# Patient Record
Sex: Female | Born: 1985 | Race: Black or African American | Hispanic: No | Marital: Single | Smoking: Former smoker
Health system: Southern US, Community
[De-identification: ages and names within clinical notes are randomized; demographics above are authoritative.]

## PROBLEM LIST (undated history)

## (undated) DIAGNOSIS — T7840XA Allergy, unspecified, initial encounter: Secondary | ICD-10-CM

## (undated) HISTORY — DX: Allergy, unspecified, initial encounter: T78.40XA

---

## 2005-03-12 ENCOUNTER — Other Ambulatory Visit: Admission: RE | Admit: 2005-03-12 | Discharge: 2005-03-12 | Payer: Self-pay | Admitting: Obstetrics and Gynecology

## 2005-05-06 ENCOUNTER — Emergency Department (HOSPITAL_COMMUNITY): Admission: EM | Admit: 2005-05-06 | Discharge: 2005-05-07 | Payer: Self-pay | Admitting: *Deleted

## 2006-09-01 ENCOUNTER — Other Ambulatory Visit: Admission: RE | Admit: 2006-09-01 | Discharge: 2006-09-01 | Payer: Self-pay | Admitting: Obstetrics and Gynecology

## 2007-11-03 ENCOUNTER — Other Ambulatory Visit: Admission: RE | Admit: 2007-11-03 | Discharge: 2007-11-03 | Payer: Self-pay | Admitting: Obstetrics and Gynecology

## 2011-06-14 ENCOUNTER — Encounter: Payer: Self-pay | Admitting: *Deleted

## 2011-06-14 ENCOUNTER — Emergency Department (HOSPITAL_COMMUNITY)
Admission: EM | Admit: 2011-06-14 | Discharge: 2011-06-14 | Disposition: A | Discharge status: Home / Self Care | Payer: BC Managed Care – PPO | Attending: Emergency Medicine | Admitting: Emergency Medicine

## 2011-06-14 DIAGNOSIS — J45909 Unspecified asthma, uncomplicated: Secondary | ICD-10-CM | POA: Insufficient documentation

## 2011-06-14 DIAGNOSIS — J029 Acute pharyngitis, unspecified: Secondary | ICD-10-CM | POA: Insufficient documentation

## 2011-06-14 LAB — RAPID STREP SCREEN (MED CTR MEBANE ONLY): Streptococcus, Group A Screen (Direct): NEGATIVE

## 2011-06-14 MED ORDER — DEXAMETHASONE 6 MG PO TABS
10.0000 mg | ORAL_TABLET | Freq: Once | ORAL | Status: AC
  Administered 2011-06-14: 10 mg via ORAL
  Filled 2011-06-14: qty 1

## 2011-06-14 MED ORDER — ALBUTEROL SULFATE HFA 108 (90 BASE) MCG/ACT IN AERS
2.0000 | INHALATION_SPRAY | RESPIRATORY_TRACT | Status: DC | PRN
  Administered 2011-06-14: 2 via RESPIRATORY_TRACT
  Filled 2011-06-14: qty 6.7

## 2011-06-14 NOTE — ED Provider Notes (Signed)
History     CSN: [id number] Arrival date & time: 06/14/2011  5:22 AM   First MD Initiated Contact with Patient 06/14/11 [phone]      Chief Complaint  Patient presents with  . Sore Throat    (Consider location/radiation/quality/duration/timing/severity/associated sxs/prior treatment) HPI This is a 25 year old black female with a history of 3 days of sore throat. He began with sore throat radiating to the left ear along with a headache, but taking ibuprofen has relieved the headache and ear pain. The pain is mild to moderate, worse with swallowing. She also feels like she is short of breath. She has a history of asthma that only usually acts up when he has an illness. She denies nasal congestion, cough, body aches, chills, nausea, vomiting or diarrhea.  Past Medical History  Diagnosis Date  . Asthma     History reviewed. No pertinent past surgical history.  History reviewed. No pertinent family history.  History  Substance Use Topics  . Smoking status: Former Games developer  . Smokeless tobacco: Not on file  . Alcohol Use: 0.6 oz/week    1 Cans of beer per week     every other day    OB History    Grav Para Term Preterm Abortions TAB SAB Ect Mult Living                  Review of Systems  All other systems reviewed and are negative.    Allergies  Sulfa antibiotics  Home Medications  No current outpatient prescriptions on file.  BP 131/92  Pulse 88  Temp(Src) 98.9 F (37.2 C) (Oral)  Resp 20  SpO2 100%  LMP 04/18/2011  Physical Exam General: Well-developed, well-nourished female in no acute distress; appearance consistent with age of record HENT: normocephalic, atraumatic; tympanic membranes without erythema; no pharyngeal erythema or exudate, tonsils mildly enlarged Eyes: pupils equal round and reactive to light; extraocular muscles intact Neck: supple; no lymphadenopathy Heart: regular rate and rhythm Lungs: Decreased air movement without frank  wheezing Abdomen: soft; nontender; nondistended Extremities: No deformity; full range of motion Neurologic: Awake, alert and oriented; motor function intact in all extremities and symmetric; no facial droop Skin: Warm and dry Psychiatric: Normal mood and affect    ED Course  Procedures (including critical care time)   MDM           Hanley Seamen, MD 06/14/11 [phone]

## 2011-06-14 NOTE — ED Notes (Signed)
Pt c/o sore throat, had ear pain also but it resolved.PT last took ibuprofen > 6 hrs ago.

## 2012-12-28 DIAGNOSIS — Z0000 Encounter for general adult medical examination without abnormal findings: Secondary | ICD-10-CM | POA: Insufficient documentation

## 2013-05-10 DIAGNOSIS — J45909 Unspecified asthma, uncomplicated: Secondary | ICD-10-CM | POA: Insufficient documentation

## 2017-08-17 DIAGNOSIS — J324 Chronic pansinusitis: Secondary | ICD-10-CM | POA: Insufficient documentation

## 2017-08-17 DIAGNOSIS — H6983 Other specified disorders of Eustachian tube, bilateral: Secondary | ICD-10-CM | POA: Insufficient documentation

## 2017-08-17 DIAGNOSIS — J3089 Other allergic rhinitis: Secondary | ICD-10-CM | POA: Insufficient documentation

## 2017-08-17 DIAGNOSIS — H6993 Unspecified Eustachian tube disorder, bilateral: Secondary | ICD-10-CM | POA: Insufficient documentation

## 2019-07-08 ENCOUNTER — Encounter: Payer: Self-pay | Admitting: Emergency Medicine

## 2019-07-08 ENCOUNTER — Other Ambulatory Visit: Payer: Self-pay

## 2019-07-08 ENCOUNTER — Ambulatory Visit
Admission: EM | Admit: 2019-07-08 | Discharge: 2019-07-08 | Disposition: A | Discharge status: Home / Self Care | Payer: 59 | Attending: Emergency Medicine | Admitting: Emergency Medicine

## 2019-07-08 DIAGNOSIS — H60501 Unspecified acute noninfective otitis externa, right ear: Secondary | ICD-10-CM

## 2019-07-08 MED ORDER — NEOMYCIN-POLYMYXIN-HC 3.5-10000-1 OT SUSP: 4.0000 [drp] | Freq: Three times a day (TID) | OTIC | 0 refills | Status: AC

## 2019-07-08 NOTE — ED Triage Notes (Addendum)
Pt presents to [facility] for assessment of right ear pain, diminished hearing, facial pressure x 4 days.  States she is having trouble closing her jaw all the way.  Denies nasal congestion, sore throat or cough

## 2019-07-08 NOTE — ED Provider Notes (Signed)
[name]    CSN: [id number] Arrival date & time: 07/08/19  1251      History   Chief Complaint Chief Complaint  Patient presents with  . APPT: 1300  . Otalgia    HPI Danielle [name] is a 34 y.o. female with h/o morbid obesity, asthma presenting for 4 day course of right ear pain.  States pressure-like, no discharge, or recent trauma, travel.  Has tried sudafed & OTC drops w/o significant relief.  No known sick contacts.  Takes antihistamine sometimes for allergies.   Past Medical History:  Diagnosis Date  . Asthma     There are no problems to display for this patient.   History reviewed. No pertinent surgical history.  OB History   No obstetric history on file.      Home Medications    Prior to Admission medications   Medication Sig Start Date End Date Taking? Authorizing Provider  neomycin-polymyxin-hydrocortisone (CORTISPORIN) 3.5-10000-1 OTIC suspension Place 4 drops into the right ear 3 (three) times daily for 7 days. 07/08/19 07/15/19  Hall-Potvin, [place], PA-C    Family History Family History  Problem Relation Age of Onset  . Hypertension Mother     Social History Social History   Tobacco Use  . Smoking status: Former Games developer  . Smokeless tobacco: Never Used  Substance Use Topics  . Alcohol use: Yes    Alcohol/week: 1.0 standard drinks    Types: 1 Cans of beer per week    Comment: every other day  . Drug use: No     Allergies   Sulfa antibiotics   Review of Systems Review of Systems  Constitutional: Negative for activity change, appetite change, fatigue and fever.  HENT: Positive for ear pain. Negative for congestion, dental problem, ear discharge, facial swelling, hearing loss, sinus pain, sore throat, trouble swallowing and voice change.   Eyes: Negative for photophobia, pain and visual disturbance.  Respiratory: Negative for cough and shortness of breath.   Cardiovascular: Negative for chest pain and palpitations.    Gastrointestinal: Negative for diarrhea and vomiting.  Musculoskeletal: Negative for arthralgias and myalgias.  Neurological: Negative for dizziness and headaches.     Physical Exam Triage Vital Signs ED Triage Vitals [07/08/19 1335]  Enc Vitals Group     BP 122/84     Pulse Rate 79     Resp 18     Temp 98.2 F (36.8 C)     Temp Source Temporal     SpO2 98 %     Weight      Height      Head Circumference      Peak Flow      Pain Score 4     Pain Loc      Pain Edu?      Excl. in [place]?    No data found.  Updated Vital Signs BP 122/84 (BP Location: Right Wrist)   Pulse 79   Temp 98.2 F (36.8 C) (Temporal)   Resp 18   LMP 06/13/2019   SpO2 98%   Visual Acuity Right Eye Distance:   Left Eye Distance:   Bilateral Distance:    Right Eye Near:   Left Eye Near:    Bilateral Near:     Physical Exam Constitutional:      General: She is not in acute distress.    Appearance: She is obese. She is not ill-appearing.  HENT:     Head: Normocephalic and atraumatic.  Jaw: There is normal jaw occlusion. No tenderness or pain on movement.     Right Ear: Hearing and tympanic membrane normal. No tenderness. No mastoid tenderness.     Left Ear: Hearing, tympanic membrane, ear canal and external ear normal. No tenderness. No mastoid tenderness.     Ears:     Comments: Positive tragal tenderness on right.  EAC w/ moderate edema, injection, scant white discharge.    Nose: No nasal deformity, septal deviation or nasal tenderness.     Right Turbinates: Not swollen or pale.     Left Turbinates: Not swollen or pale.     Right Sinus: No maxillary sinus tenderness or frontal sinus tenderness.     Left Sinus: No maxillary sinus tenderness or frontal sinus tenderness.     Mouth/Throat:     Lips: Pink. No lesions.     Mouth: Mucous membranes are moist. No injury.     Pharynx: Oropharynx is clear. Uvula midline. No posterior oropharyngeal erythema or uvula swelling.     Comments: no  tonsillar exudate or hypertrophy Eyes:     General: No scleral icterus.    Conjunctiva/sclera: Conjunctivae normal.     Pupils: Pupils are equal, round, and reactive to light.  Cardiovascular:     Rate and Rhythm: Normal rate.  Pulmonary:     Effort: Pulmonary effort is normal. No respiratory distress.  Musculoskeletal:     Cervical back: Normal range of motion and neck supple. No tenderness. No muscular tenderness.  Lymphadenopathy:     Cervical: No cervical adenopathy.  Skin:    Capillary Refill: Capillary refill takes less than 2 seconds.  Neurological:     Mental Status: She is alert and oriented to person, place, and time.      UC Treatments / Results  Labs (all labs ordered are listed, but only abnormal results are displayed) Labs Reviewed - No data to display  EKG   Radiology No results found.  Procedures Procedures (including critical care time)  Medications Ordered in UC Medications - No data to display  Initial Impression / Assessment and Plan / UC Course  I have reviewed the triage vital signs and the nursing notes.  Pertinent labs & imaging results that were available during my care of the patient were reviewed by me and considered in my medical decision making (see chart for details).     Afebrile, nontoxic.  H&P consistent w/ EOM.  Start cortisporin. Return precautions discussed, patient verbalized understanding and is agreeable to plan. Final Clinical Impressions(s) / UC Diagnoses   Final diagnoses:  Acute otitis externa of right ear, unspecified type     Discharge Instructions     Use eardrops as prescribed for the next week. Return for worsening ear pain, swelling, discharge, bleeding, decreased hearing, development of jaw pain/swelling, fever.  Do NOT use Q-tips as these can cause your ear wax to get stuck, the tips may break off and become a foreign body requiring additional medical care, or puncture your eardrum.  Helpful prevention  tip: Use a solution of equal parts isopropyl (rubbing) alcohol and white vinegar (acetic acid) in both ears after swimming.    ED Prescriptions    Medication Sig Dispense Auth. Provider   neomycin-polymyxin-hydrocortisone (CORTISPORIN) 3.5-10000-1 OTIC suspension Place 4 drops into the right ear 3 (three) times daily for 7 days. 10 mL Hall-Potvin, [place], PA-C     PDMP not reviewed this encounter.   Hall-Potvin, [place], [place] 07/08/19 1405

## 2019-07-08 NOTE — Discharge Instructions (Signed)
Use eardrops as prescribed for the next week. Return for worsening ear pain, swelling, discharge, bleeding, decreased hearing, development of jaw pain/swelling, fever.  Do NOT use Q-tips as these can cause your ear wax to get stuck, the tips may break off and become a foreign body requiring additional medical care, or puncture your eardrum.  Helpful prevention tip: Use a solution of equal parts isopropyl (rubbing) alcohol and white vinegar (acetic acid) in both ears after swimming. 

## 2019-11-16 ENCOUNTER — Other Ambulatory Visit: Payer: Self-pay

## 2019-11-16 ENCOUNTER — Encounter: Payer: Self-pay | Admitting: Emergency Medicine

## 2019-11-16 ENCOUNTER — Ambulatory Visit
Admission: EM | Admit: 2019-11-16 | Discharge: 2019-11-16 | Disposition: A | Discharge status: Home / Self Care | Payer: BC Managed Care – PPO

## 2019-11-16 DIAGNOSIS — M25512 Pain in left shoulder: Secondary | ICD-10-CM

## 2019-11-16 MED ORDER — PREDNISONE 50 MG PO TABS: 50.0000 mg | ORAL_TABLET | Freq: Every day | ORAL | 0 refills | Status: DC

## 2019-11-16 MED ORDER — TIZANIDINE HCL 2 MG PO TABS: 2.0000 mg | ORAL_TABLET | Freq: Three times a day (TID) | ORAL | 0 refills | Status: DC | PRN

## 2019-11-16 NOTE — ED Triage Notes (Signed)
Pt sts left shoulder pain x 2 months since 1 week after received second covid vaccine; pt denies obvious injury and sts decreased ROM

## 2019-11-16 NOTE — ED Provider Notes (Signed)
[name]    CSN: [id number] Arrival date & time: 11/16/19  1659      History   Chief Complaint Chief Complaint  Patient presents with  . Shoulder Pain    HPI Danielle [name] is a 34 y.o. female.   34 year old female comes in for 40-month history of left shoulder pain.  Denies injury/trauma.  States pain for started a week after receiving J&J vaccine.  She had localized swelling to the deltoid after vaccine, this has since resolved, now with pain to the shoulder, thoracic back.  Denies pain at rest, pain with range of motion.  Denies loss of grip strength.  Has been doing ice compress, heat compress, Advil with mild relief.     Past Medical History:  Diagnosis Date  . Asthma     There are no problems to display for this patient.   History reviewed. No pertinent surgical history.  OB History   No obstetric history on file.      Home Medications    Prior to Admission medications   Medication Sig Start Date End Date Taking? Authorizing Provider  famotidine (PEPCID) 10 MG tablet Take 10 mg by mouth 2 (two) times daily.   Yes [provider]  predniSONE (DELTASONE) 50 MG tablet Take 1 tablet (50 mg total) by mouth daily with breakfast. 11/16/19   Tasia Catchings, Sharel Behne V, PA-C  tiZANidine (ZANAFLEX) 2 MG tablet Take 1 tablet (2 mg total) by mouth every 8 (eight) hours as needed for muscle spasms. 11/16/19   Ok Edwards, PA-C    Family History Family History  Problem Relation Age of Onset  . Hypertension Mother     Social History Social History   Tobacco Use  . Smoking status: Former Research scientist (life sciences)  . Smokeless tobacco: Never Used  Substance Use Topics  . Alcohol use: Yes    Alcohol/week: 1.0 standard drinks    Types: 1 Cans of beer per week    Comment: every other day  . Drug use: No     Allergies   Sulfa antibiotics   Review of Systems Review of Systems  Reason unable to perform ROS: See HPI as above.     Physical Exam Triage Vital Signs ED  Triage Vitals  Enc Vitals Group     BP 11/16/19 1718 114/79     Pulse Rate 11/16/19 1718 86     Resp 11/16/19 1718 16     Temp 11/16/19 1718 98.4 F (36.9 C)     Temp Source 11/16/19 1718 Oral     SpO2 11/16/19 1718 97 %     Weight --      Height --      Head Circumference --      Peak Flow --      Pain Score 11/16/19 1715 9     Pain Loc --      Pain Edu? --      Excl. in [place]? --    No data found.  Updated Vital Signs BP 114/79 (BP Location: Right Arm)   Pulse 86   Temp 98.4 F (36.9 C) (Oral)   Resp 16   SpO2 97%   Physical Exam Constitutional:      General: She is not in acute distress.    Appearance: Normal appearance. She is well-developed. She is not toxic-appearing or diaphoretic.  HENT:     Head: Normocephalic and atraumatic.  Eyes:     Conjunctiva/sclera: Conjunctivae normal.     Pupils:  Pupils are equal, round, and reactive to light.  Pulmonary:     Effort: Pulmonary effort is normal. No respiratory distress.     Comments: Speaking in full sentences without difficulty Musculoskeletal:     Cervical back: Normal range of motion and neck supple.     Comments: No tenderness to palpation of spinous processes.  Tenderness to palpation of left middle trapezius/thoracic back.  No tenderness to palpation of the deltoid.  Tenderness to palpation of anterior shoulder, along bicep tendon insert.  Decreased abduction.  Strength 5/5 BUE. Sensation 4/5 to the left fingers. Radial pulse 2+, cap refill <2s  Skin:    General: Skin is warm and dry.  Neurological:     Mental Status: She is alert and oriented to person, place, and time.      UC Treatments / Results  Labs (all labs ordered are listed, but only abnormal results are displayed) Labs Reviewed - No data to display  EKG   Radiology No results found.  Procedures Procedures (including critical care time)  Medications Ordered in UC Medications - No data to display  Initial Impression / Assessment and Plan  / UC Course  I have reviewed the triage vital signs and the nursing notes.  Pertinent labs & imaging results that were available during my care of the patient were reviewed by me and considered in my medical decision making (see chart for details).    Prednisone as directed.  Muscle relaxer as needed.  Return precautions given.  Patient expresses understanding and agrees to plan.  Final Clinical Impressions(s) / UC Diagnoses   Final diagnoses:  Acute pain of left shoulder   ED Prescriptions    Medication Sig Dispense Auth. Provider   predniSONE (DELTASONE) 50 MG tablet Take 1 tablet (50 mg total) by mouth daily with breakfast. 5 tablet Macarius Ruark V, PA-C   tiZANidine (ZANAFLEX) 2 MG tablet Take 1 tablet (2 mg total) by mouth every 8 (eight) hours as needed for muscle spasms. 15 tablet Belinda Fisher, PA-C     PDMP not reviewed this encounter.   Belinda Fisher, PA-C 11/16/19 [phone]

## 2019-11-16 NOTE — Discharge Instructions (Signed)
Prednisone as directed. Tizanidine as needed, this can make you drowsy, so do not take if you are going to drive, operate heavy machinery, or make important decisions. Ice/heat compresses as needed.  Follow up with PCP/orthopedics if symptoms worsen, changes for reevaluation.

## 2020-03-22 ENCOUNTER — Encounter: Payer: Self-pay | Admitting: Physician Assistant

## 2020-03-22 ENCOUNTER — Ambulatory Visit
Admission: EM | Admit: 2020-03-22 | Discharge: 2020-03-22 | Disposition: A | Discharge status: Home / Self Care | Payer: BC Managed Care – PPO | Attending: Physician Assistant | Admitting: Physician Assistant

## 2020-03-22 ENCOUNTER — Other Ambulatory Visit: Payer: Self-pay

## 2020-03-22 DIAGNOSIS — L509 Urticaria, unspecified: Secondary | ICD-10-CM

## 2020-03-22 MED ORDER — DEXAMETHASONE SODIUM PHOSPHATE 10 MG/ML IJ SOLN
10.0000 mg | Freq: Once | INTRAMUSCULAR | Status: AC
  Administered 2020-03-22: 10 mg via INTRAMUSCULAR

## 2020-03-22 MED ORDER — SARNA 0.5-0.5 % EX LOTN: 1.0000 "application " | TOPICAL_LOTION | CUTANEOUS | 0 refills | Status: DC | PRN

## 2020-03-22 NOTE — ED Triage Notes (Signed)
Pt presents for rash on face and chest that started yesterday.  She reports itching and denies pain.

## 2020-03-22 NOTE — Discharge Instructions (Signed)
Decadron injection in office today. Continue zyrtec. SARNA lotion for itching.

## 2020-03-22 NOTE — ED Provider Notes (Signed)
[name]    CSN: [id number] Arrival date & time: 03/22/20  0806      History   Chief Complaint Chief Complaint  Patient presents with  . Rash    itching, redness started yesterday    HPI TIFFENY MINCHEW is a 34 y.o. female.   34 year old female comes in for rash starting yesterday.  Patient states for started itching to the left elbow, and shortly after started itching throughout the body.  Noticed rash throughout the face, trunk, extremities.  Took Benadryl with some relief of symptoms.  Used hydrocortisone with some relief of symptoms.  But this morning, itching increased, and rash is still present.  States was exposed to [employer], other bugs without known bites/stings.  No changes in food, hygiene products.     Past Medical History:  Diagnosis Date  . Asthma     There are no problems to display for this patient.   History reviewed. No pertinent surgical history.  OB History   No obstetric history on file.      Home Medications    Prior to Admission medications   Medication Sig Start Date End Date Taking? Authorizing Provider  camphor-menthol Wynelle Fanny) lotion Apply 1 application topically as needed for itching. 03/22/20   Cathie Hoops, Ndidi Nesby V, PA-C  famotidine (PEPCID) 10 MG tablet Take 10 mg by mouth 2 (two) times daily.  03/22/20  [provider]    Family History Family History  Problem Relation Age of Onset  . Hypertension Mother     Social History Social History   Tobacco Use  . Smoking status: Former Games developer  . Smokeless tobacco: Never Used  Substance Use Topics  . Alcohol use: Yes    Alcohol/week: 1.0 standard drink    Types: 1 Cans of beer per week    Comment: every other day  . Drug use: No     Allergies   Sulfa antibiotics   Review of Systems Review of Systems  Reason unable to perform ROS: See HPI as above.     Physical Exam Triage Vital Signs ED Triage Vitals  Enc Vitals Group     BP 03/22/20 0842 116/84      Pulse Rate 03/22/20 0842 82     Resp 03/22/20 0842 14     Temp 03/22/20 0842 98 F (36.7 C)     Temp Source 03/22/20 0842 Oral     SpO2 03/22/20 0842 98 %     Weight --      Height --      Head Circumference --      Peak Flow --      Pain Score 03/22/20 0903 0     Pain Loc --      Pain Edu? --      Excl. in [place]? --    No data found.  Updated Vital Signs BP 116/84 (BP Location: Left Arm)   Pulse 82   Temp 98.2 F (36.8 C) (Oral)   Resp 14   SpO2 98%   Physical Exam Constitutional:      General: She is not in acute distress.    Appearance: Normal appearance. She is well-developed. She is not toxic-appearing or diaphoretic.  HENT:     Head: Normocephalic and atraumatic.  Eyes:     Conjunctiva/sclera: Conjunctivae normal.     Pupils: Pupils are equal, round, and reactive to light.  Pulmonary:     Effort: Pulmonary effort is normal. No respiratory distress.  Musculoskeletal:  Cervical back: Normal range of motion and neck supple.  Skin:    General: Skin is warm and dry.     Comments: Diffuse hives. No erythema, warmth. Patient itching throughout exam  Neurological:     Mental Status: She is alert and oriented to person, place, and time.      UC Treatments / Results  Labs (all labs ordered are listed, but only abnormal results are displayed) Labs Reviewed - No data to display  EKG   Radiology No results found.  Procedures Procedures (including critical care time)  Medications Ordered in UC Medications  dexamethasone (DECADRON) injection 10 mg (has no administration in time range)    Initial Impression / Assessment and Plan / UC Course  I have reviewed the triage vital signs and the nursing notes.  Pertinent labs & imaging results that were available during my care of the patient were reviewed by me and considered in my medical decision making (see chart for details).    Decadron injection in office today.  Continue antihistamine.  Other  symptomatic treatment discussed.  Return precautions given.  Final Clinical Impressions(s) / UC Diagnoses   Final diagnoses:  Hives    ED Prescriptions    Medication Sig Dispense Auth. Provider   camphor-menthol [facility] lotion Apply 1 application topically as needed for itching. 222 mL Belinda Fisher, PA-C     PDMP not reviewed this encounter.   Belinda Fisher, PA-C 03/22/20 [phone]

## 2020-04-02 DIAGNOSIS — Z30430 Encounter for insertion of intrauterine contraceptive device: Secondary | ICD-10-CM | POA: Insufficient documentation

## 2020-04-02 DIAGNOSIS — Z3046 Encounter for surveillance of implantable subdermal contraceptive: Secondary | ICD-10-CM | POA: Insufficient documentation

## 2020-08-13 ENCOUNTER — Ambulatory Visit: Payer: BC Managed Care – PPO | Admitting: Internal Medicine

## 2020-11-28 ENCOUNTER — Ambulatory Visit
Admission: EM | Admit: 2020-11-28 | Discharge: 2020-11-28 | Disposition: A | Discharge status: Home / Self Care | Payer: BC Managed Care – PPO | Attending: Emergency Medicine | Admitting: Emergency Medicine

## 2020-11-28 ENCOUNTER — Other Ambulatory Visit: Payer: Self-pay

## 2020-11-28 DIAGNOSIS — H60392 Other infective otitis externa, left ear: Secondary | ICD-10-CM

## 2020-11-28 DIAGNOSIS — H66002 Acute suppurative otitis media without spontaneous rupture of ear drum, left ear: Secondary | ICD-10-CM | POA: Diagnosis not present

## 2020-11-28 MED ORDER — AMOXICILLIN-POT CLAVULANATE 875-125 MG PO TABS: 1.0000 | ORAL_TABLET | Freq: Two times a day (BID) | ORAL | 0 refills | Status: AC

## 2020-11-28 MED ORDER — NEOMYCIN-POLYMYXIN-HC 3.5-10000-1 OT SUSP: 3.0000 [drp] | Freq: Four times a day (QID) | OTIC | 0 refills | Status: DC

## 2020-11-28 NOTE — ED Triage Notes (Signed)
Pt presents with complaints of left ear pain since Wednesday. Reports history of ear infection and this feels the same.

## 2020-11-28 NOTE — ED Provider Notes (Signed)
[name]    CSN: [id number] Arrival date & time: 11/28/20  1306      History   Chief Complaint Chief Complaint  Patient presents with  . Otalgia    HPI Danielle [name] is a 35 y.o. female presenting today for evaluation of left ear pain.  Reports pain over the past 3 days.  History of similar with ear infections.  Denies significant associated congestion or sinus pressure.  Has had a frontal headache.  HPI  Past Medical History:  Diagnosis Date  . Asthma     There are no problems to display for this patient.   History reviewed. No pertinent surgical history.  OB History   No obstetric history on file.      Home Medications    Prior to Admission medications   Medication Sig Start Date End Date Taking? Authorizing Provider  amoxicillin-clavulanate (AUGMENTIN) 875-125 MG tablet Take 1 tablet by mouth every 12 (twelve) hours for 7 days. 11/28/20 12/05/20 Yes Rayhana Slider C, PA-C  neomycin-polymyxin-hydrocortisone (CORTISPORIN) 3.5-10000-1 OTIC suspension Place 3 drops into the left ear 4 (four) times daily. 11/28/20  Yes Nathalee Smarr C, PA-C  camphor-menthol (SARNA) lotion Apply 1 application topically as needed for itching. 03/22/20   Cathie Hoops, Amy V, PA-C  famotidine (PEPCID) 10 MG tablet Take 10 mg by mouth 2 (two) times daily.  03/22/20  [provider]    Family History Family History  Problem Relation Age of Onset  . Hypertension Mother   . Healthy Father     Social History Social History   Tobacco Use  . Smoking status: Former Games developer  . Smokeless tobacco: Never Used  Substance Use Topics  . Alcohol use: Yes    Alcohol/week: 1.0 standard drink    Types: 1 Cans of beer per week    Comment: every other day  . Drug use: No     Allergies   Sulfa antibiotics   Review of Systems Review of Systems  Constitutional: Negative for activity change, appetite change, chills, fatigue and fever.  HENT: Positive for ear pain. Negative  for congestion, rhinorrhea, sinus pressure, sore throat and trouble swallowing.   Eyes: Negative for discharge and redness.  Respiratory: Negative for cough, chest tightness and shortness of breath.   Cardiovascular: Negative for chest pain.  Gastrointestinal: Negative for abdominal pain, diarrhea, nausea and vomiting.  Musculoskeletal: Negative for myalgias.  Skin: Negative for rash.  Neurological: Negative for dizziness, light-headedness and headaches.     Physical Exam Triage Vital Signs ED Triage Vitals  Enc Vitals Group     BP 11/28/20 1436 127/88     Pulse Rate 11/28/20 1436 77     Resp 11/28/20 1436 19     Temp 11/28/20 1436 98.7 F (37.1 C)     Temp src --      SpO2 11/28/20 1436 98 %     Weight --      Height --      Head Circumference --      Peak Flow --      Pain Score 11/28/20 1435 7     Pain Loc --      Pain Edu? --      Excl. in GC? --    No data found.  Updated Vital Signs BP 127/88   Pulse 77   Temp 98.7 F (37.1 C)   Resp 19   SpO2 98%   Visual Acuity Right Eye Distance:   Left Eye Distance:  Bilateral Distance:    Right Eye Near:   Left Eye Near:    Bilateral Near:     Physical Exam Vitals and nursing note reviewed.  Constitutional:      Appearance: She is well-developed.     Comments: No acute distress  HENT:     Head: Normocephalic and atraumatic.     Ears:     Comments: Left ear with tragus tenderness, canal appears erythematous and mildly swollen, TM intact but appears irregular dull and with mild erythema/opaque    Nose: Nose normal.  Eyes:     Conjunctiva/sclera: Conjunctivae normal.  Cardiovascular:     Rate and Rhythm: Normal rate.  Pulmonary:     Effort: Pulmonary effort is normal. No respiratory distress.  Abdominal:     General: There is no distension.  Musculoskeletal:        General: Normal range of motion.     Cervical back: Neck supple.  Skin:    General: Skin is warm and dry.  Neurological:     Mental  Status: She is alert and oriented to person, place, and time.      UC Treatments / Results  Labs (all labs ordered are listed, but only abnormal results are displayed) Labs Reviewed - No data to display  EKG   Radiology No results found.  Procedures Procedures (including critical care time)  Medications Ordered in UC Medications - No data to display  Initial Impression / Assessment and Plan / UC Course  I have reviewed the triage vital signs and the nursing notes.  Pertinent labs & imaging results that were available during my care of the patient were reviewed by me and considered in my medical decision making (see chart for details).     Treating for otitis externa and media with Augmentin and Cortisporin, anti-inflammatories for pain, monitor for gradual resolution of symptoms.  Discussed strict return precautions. Patient verbalized understanding and is agreeable with plan.  Final Clinical Impressions(s) / UC Diagnoses   Final diagnoses:  Non-recurrent acute suppurative otitis media of left ear without spontaneous rupture of tympanic membrane  Other infective acute otitis externa of left ear     Discharge Instructions     Begin Augmentin twice daily for 1 week Cortisporin eardrops 3 drops 4 times daily Keep ear clean and dry Tylenol and ibuprofen as needed for any further pain relief Follow-up if not improving or worsening    ED Prescriptions    Medication Sig Dispense Auth. Provider   neomycin-polymyxin-hydrocortisone (CORTISPORIN) 3.5-10000-1 OTIC suspension Place 3 drops into the left ear 4 (four) times daily. 10 mL Zella Dewan C, PA-C   amoxicillin-clavulanate (AUGMENTIN) 875-125 MG tablet Take 1 tablet by mouth every 12 (twelve) hours for 7 days. 14 tablet Krishna Dancel, [place] C, PA-C     PDMP not reviewed this encounter.   Lew Dawes, [place] 11/28/20 1519

## 2020-11-28 NOTE — Discharge Instructions (Signed)
Begin Augmentin twice daily for 1 week Cortisporin eardrops 3 drops 4 times daily Keep ear clean and dry Tylenol and ibuprofen as needed for any further pain relief Follow-up if not improving or worsening

## 2020-12-13 ENCOUNTER — Ambulatory Visit: Payer: Self-pay

## 2021-01-31 ENCOUNTER — Other Ambulatory Visit: Payer: Self-pay

## 2021-01-31 ENCOUNTER — Ambulatory Visit
Admission: RE | Admit: 2021-01-31 | Discharge: 2021-01-31 | Disposition: A | Discharge status: Home / Self Care | Payer: BC Managed Care – PPO | Source: Ambulatory Visit

## 2021-01-31 VITALS — BP 129/82 | HR 90 | Temp 98.9°F | Resp 14

## 2021-01-31 DIAGNOSIS — J4521 Mild intermittent asthma with (acute) exacerbation: Secondary | ICD-10-CM

## 2021-01-31 DIAGNOSIS — U071 COVID-19: Secondary | ICD-10-CM | POA: Diagnosis not present

## 2021-01-31 MED ORDER — ALBUTEROL SULFATE HFA 108 (90 BASE) MCG/ACT IN AERS
2.0000 | INHALATION_SPRAY | Freq: Once | RESPIRATORY_TRACT | Status: AC
  Administered 2021-01-31: 2 via RESPIRATORY_TRACT

## 2021-01-31 MED ORDER — PREDNISONE 20 MG PO TABS: 40.0000 mg | ORAL_TABLET | Freq: Every day | ORAL | 0 refills | Status: DC

## 2021-01-31 MED ORDER — PROMETHAZINE-DM 6.25-15 MG/5ML PO SYRP: 5.0000 mL | ORAL_SOLUTION | Freq: Four times a day (QID) | ORAL | 0 refills | Status: DC | PRN

## 2021-01-31 NOTE — ED Triage Notes (Signed)
Began having symptoms Sunday, congestion, productive cough, chills, generalized body aches, headaches. Tested positive for Covid Wednesday. Hx of asthma, out of proair inhaler

## 2021-01-31 NOTE — Discharge Instructions (Addendum)
Guidance at present is wait 12 weeks before receiving COVID booster. Check CDC website for recommendation related to changes to boosers. Use albuterol inhaler 2 puffs every 4-6 hours as needed for shortness of breath or wheezing.  I prescribed you prednisone 40 mg once daily for 5 days to help improve work of breathing and decrease any inflammation related to COVID.  You have also been prescribed antiviral therapy this is to shorten the course of the COVID-19 virus and prevent hospitalization related to COVID complications.  Promethazine DM this is for cough and any nasal symptoms.  If anytime you develop any severe shortness of breath or chest pressure or pain go immediately to the nearest emergency department.

## 2021-01-31 NOTE — ED Provider Notes (Signed)
[name]    CSN: [id number] Arrival date & time: 01/31/21  8242      History   Chief Complaint Chief Complaint  Patient presents with   Cough    HPI Danielle [name] is a 35 y.o. female.   HPI Patient tested  positive for covid test 5 days ago.  She has subsequently experienced some asthma related symptoms of mild shortness of breath and occasional wheezing.  She also has a persistent cough along with nasal drainage and congestion along with sneezing.  She has been taken over-the-counter medication with mild relief.  She is also out of her albuterol inhaler. Past Medical History:  Diagnosis Date   Asthma     There are no problems to display for this patient.   History reviewed. No pertinent surgical history.  OB History   No obstetric history on file.      Home Medications    Prior to Admission medications   Medication Sig Start Date End Date Taking? Authorizing Provider  cetirizine (ZYRTEC) 10 MG tablet Take 10 mg by mouth daily.   Yes [provider]  predniSONE (DELTASONE) 20 MG tablet Take 2 tablets (40 mg total) by mouth daily with breakfast. 01/31/21  Yes Bing Neighbors, FNP  promethazine-dextromethorphan (PROMETHAZINE-DM) 6.25-15 MG/5ML syrup Take 5 mLs by mouth 4 (four) times daily as needed for cough. 01/31/21  Yes Bing Neighbors, FNP  camphor-menthol [facility]) lotion Apply 1 application topically as needed for itching. 03/22/20   Cathie Hoops, Amy V, PA-C  neomycin-polymyxin-hydrocortisone (CORTISPORIN) 3.5-10000-1 OTIC suspension Place 3 drops into the left ear 4 (four) times daily. 11/28/20   Wieters, Hallie C, PA-C  famotidine (PEPCID) 10 MG tablet Take 10 mg by mouth 2 (two) times daily.  03/22/20  [provider]    Family History Family History  Problem Relation Age of Onset   Hypertension Mother    Healthy Father     Social History Social History   Tobacco Use   Smoking status: Former   Smokeless tobacco: Never   Substance Use Topics   Alcohol use: Yes    Alcohol/week: 1.0 standard drink    Types: 1 Cans of beer per week    Comment: every other day   Drug use: No     Allergies   Sulfa antibiotics   Review of Systems Review of Systems Pertinent negatives listed in HPI   Physical Exam Triage Vital Signs ED Triage Vitals [01/31/21 1118]  Enc Vitals Group     BP 129/82     Pulse Rate 90     Resp 14     Temp 98.9 F (37.2 C)     Temp Source Oral     SpO2 94 %     Weight      Height      Head Circumference      Peak Flow      Pain Score 0     Pain Loc      Pain Edu?      Excl. in GC?    No data found.  Updated Vital Signs BP 129/82 (BP Location: Left Arm)   Pulse 90   Temp 98.9 F (37.2 C) (Oral)   Resp 14   SpO2 94%   Visual Acuity Right Eye Distance:   Left Eye Distance:   Bilateral Distance:    Right Eye Near:   Left Eye Near:    Bilateral Near:     Physical Exam General  Appearance:    Alert, cooperative, no distress  HENT:   Normocephalic, ear normal, neck without nodes and nasal mucosa congested and rhinorrhea   Eyes:    PERRL, conjunctiva/corneas clear, EOM's intact       Lungs:     Normal rate, coarse lung sounds, expiratory wheeze   unlabored  Heart:    Regular rate and rhythm  Neurologic:   Awake, alert, oriented x 3. No apparent focal neurological deficit.         UC Treatments / Results  Labs (all labs ordered are listed, but only abnormal results are displayed) Labs Reviewed - No data to display  EKG   Radiology No results found.  Procedures Procedures (including critical care time)  Medications Ordered in UC Medications  albuterol (VENTOLIN HFA) 108 (90 Base) MCG/ACT inhaler 2 puff (2 puffs Inhalation Given 01/31/21 1145)    Initial Impression / Assessment and Plan / UC Course  I have reviewed the triage vital signs and the nursing notes.  Pertinent labs & imaging results that were available during my care of the patient were  reviewed by me and considered in my medical decision making (see chart for details).    COVID-19 virus with a mild asthma exacerbation.  Treatment per discharge instructions.  Antiviral therapy initiated.  Note provided to return to work on Monday provided patient is afebrile.  ER precautions given. Final Clinical Impressions(s) / UC Diagnoses   Final diagnoses:  COVID-19 virus infection  Mild intermittent asthma with acute exacerbation     Discharge Instructions      Guidance at present is wait 12 weeks before receiving COVID booster. Check CDC website for recommendation related to changes to boosers. Use albuterol inhaler 2 puffs every 4-6 hours as needed for shortness of breath or wheezing.  I prescribed you prednisone 40 mg once daily for 5 days to help improve work of breathing and decrease any inflammation related to COVID.  You have also been prescribed antiviral therapy this is to shorten the course of the COVID-19 virus and prevent hospitalization related to COVID complications.  Promethazine DM this is for cough and any nasal symptoms.  If anytime you develop any severe shortness of breath or chest pressure or pain go immediately to the nearest emergency department.     ED Prescriptions     Medication Sig Dispense Auth. Provider   promethazine-dextromethorphan (PROMETHAZINE-DM) 6.25-15 MG/5ML syrup Take 5 mLs by mouth 4 (four) times daily as needed for cough. 140 mL Bing Neighbors, FNP   predniSONE (DELTASONE) 20 MG tablet Take 2 tablets (40 mg total) by mouth daily with breakfast. 10 tablet Bing Neighbors, FNP      PDMP not reviewed this encounter.   Bing Neighbors, FNP 01/31/21 1158

## 2021-03-13 ENCOUNTER — Other Ambulatory Visit: Payer: Self-pay

## 2021-03-13 ENCOUNTER — Ambulatory Visit (INDEPENDENT_AMBULATORY_CARE_PROVIDER_SITE_OTHER): Payer: BC Managed Care – PPO

## 2021-03-13 ENCOUNTER — Ambulatory Visit (INDEPENDENT_AMBULATORY_CARE_PROVIDER_SITE_OTHER): Payer: BC Managed Care – PPO | Admitting: Student

## 2021-03-13 ENCOUNTER — Encounter: Payer: Self-pay | Admitting: Student

## 2021-03-13 VITALS — BP 126/74 | HR 80 | Temp 98.7°F | Ht 67.0 in | Wt >= 6400 oz

## 2021-03-13 DIAGNOSIS — R0600 Dyspnea, unspecified: Secondary | ICD-10-CM

## 2021-03-13 DIAGNOSIS — R0609 Other forms of dyspnea: Secondary | ICD-10-CM

## 2021-03-13 DIAGNOSIS — R0602 Shortness of breath: Secondary | ICD-10-CM | POA: Diagnosis not present

## 2021-03-13 MED ORDER — PANTOPRAZOLE SODIUM 40 MG PO TBEC: 40.0000 mg | DELAYED_RELEASE_TABLET | Freq: Every day | ORAL | 0 refills | Status: DC

## 2021-03-13 NOTE — Patient Instructions (Signed)
-   flonase everyday after clearing your nose following a shower  - start protonix 40 mg every morning 30 min before eating - x ray and labs today - we will schedule breathing tests and I'll get in touch with results - see you tentatively in 3 months

## 2021-03-13 NOTE — Addendum Note (Signed)
Addended by: Demetrio Lapping E on: 03/13/2021 03:24 PM   Modules accepted: Orders

## 2021-03-13 NOTE — Progress Notes (Signed)
Synopsis: Referred for asthma by self  Subjective:   PATIENT ID: Danielle [name] GENDER: female DOB: 02-22-1986, MRN: [id number]  Chief Complaint  Patient presents with   Consult    Pt states she was diagnosed with asthma at age 35. States that she does have some SOB.   35yF with history of asthma and recent covid-19 infection presents to establish care for asthma.  Regarding her asthma:  She has tried albuterol only. She is pretty sure it helps when she feels quite dyspneic. Never has been hospitalized for asthma. SHe has been given steroids for asthma, previously these courses have improved her asthma. This past time when she was given them for covid it wasn't really helpful. Her asthma symptoms are primarily dyspnea with exertion, chest tightness. Little in way of cough. She frequently has sinonasal congestion and PND - switches between zyrtec and xyzal - some seasonal component to it, worse in spring. Takes pepcid or zantac as needed for reflux. Cutting back on soft drinks helps some with GERD symptoms.  She has no photophobia, rashes.   Otherwise pertinent review of systems is negative.  She doesn't snore. No paroxysmal nocturnal dyspnea. Perhaps mild excessive daytime sleepiness.   Mom has sarcoidosis - she did require treatment in the past, had ocular involvement.  She works as an Dance movement psychotherapist at [employer]. No occupational exposures to dusts/solvents without a mask. Never has lived outside of [place]. She has no pets citing allergy to cats, dogs - she had a dog in college and this was the context in which she discovered she had asthma.   Some cigarettes/MJ in remote past.   Past Medical History:  Diagnosis Date   Asthma      Family History  Problem Relation Age of Onset   Hypertension Mother    Healthy Father      No past surgical history on file.  Social History   Socioeconomic History   Marital status: Single    Spouse name: Not on file   Number of  children: Not on file   Years of education: Not on file   Highest education level: Not on file  Occupational History   Not on file  Tobacco Use   Smoking status: Former    Packs/day: 0.10    Years: 5.00    Pack years: 0.50    Types: Cigarettes    Quit date: 2009    Years since quitting: 13.6   Smokeless tobacco: Never  Substance and Sexual Activity   Alcohol use: Yes    Alcohol/week: 1.0 standard drink    Types: 1 Cans of beer per week    Comment: every other day   Drug use: No   Sexual activity: Yes    Birth control/protection: Implant  Other Topics Concern   Not on file  Social History Narrative   Not on file   Social Determinants of Health   Financial Resource Strain: Not on file  Food Insecurity: Not on file  Transportation Needs: Not on file  Physical Activity: Not on file  Stress: Not on file  Social Connections: Not on file  Intimate Partner Violence: Not on file     Allergies  Allergen Reactions   Sulfa Antibiotics Rash     Outpatient Medications Prior to Visit  Medication Sig Dispense Refill   albuterol (VENTOLIN HFA) 108 (90 Base) MCG/ACT inhaler Inhale into the lungs.     neomycin-polymyxin-hydrocortisone (CORTISPORIN) 3.5-10000-1 OTIC suspension Place 3 drops into the  left ear 4 (four) times daily. 10 mL 0   camphor-menthol (SARNA) lotion Apply 1 application topically as needed for itching. 222 mL 0   cetirizine (ZYRTEC) 10 MG tablet Take 10 mg by mouth daily.     predniSONE (DELTASONE) 20 MG tablet Take 2 tablets (40 mg total) by mouth daily with breakfast. 10 tablet 0   promethazine-dextromethorphan (PROMETHAZINE-DM) 6.25-15 MG/5ML syrup Take 5 mLs by mouth 4 (four) times daily as needed for cough. 140 mL 0   No facility-administered medications prior to visit.       Objective:   Physical Exam:  General appearance: 35 y.o., female, NAD, conversant  Eyes: anicteric sclerae, moist conjunctivae; no lid-lag; PERRL, tracking appropriately HENT:  NCAT; oropharynx, MMM, no mucosal ulcerations; normal hard and soft palate, nasal mucosa is boggy, edematous with narrow passage b/l Neck: Trachea midline; no lymphadenopathy, no JVD Lungs: CTAB, no crackles, no wheeze, with normal respiratory effort CV: RRR, no MRGs  Abdomen: Soft, non-tender; non-distended, BS present  Extremities: No peripheral edema, radial and DP pulses present bilaterally  Skin: Normal temperature, turgor and texture; no rash Psych: Appropriate affect Neuro: Alert and oriented to person and place, no focal deficit    Vitals:   03/13/21 1439  BP: 126/74  Pulse: 80  Temp: 98.7 F (37.1 C)  TempSrc: Oral  SpO2: 98%  Weight: (!) 409 lb 9.6 oz (185.8 kg)  Height: 5\' 7"  (1.702 m)   98% on RA BMI Readings from Last 3 Encounters:  03/13/21 64.15 kg/m   Wt Readings from Last 3 Encounters:  03/13/21 (!) 409 lb 9.6 oz (185.8 kg)     CBC No results found for: WBC, RBC, HGB, HCT, PLT, MCV, MCH, MCHC, RDW, LYMPHSABS, MONOABS, EOSABS, BASOSABS  Hb 2014 was 11.3  Diff from 2014 without Eos  HIV neg 05/2020  Chest Imaging:  [facility] CXR report 09/2014 reviewed by me and unremarkable  Pulmonary Functions Testing Results: No flowsheet data found.     Assessment & Plan:   # Dyspnea on exertion # History of asthma  I'm unsure if symptoms truly are driven by asthma. If so, triggers could be rhinitis/PND, GERD.    Plan: - PFTs, if no reversible obstructive defect will recommend methacholine challenge prior to starting ICS/LABA - CXR PA/lateral - TSH, cbc/diff, BNP, BMP - discuss sleep study at next visit - start ppi 40 mg daily before breakfast     10/2014, MD [facility] 03/13/2021 2:43 PM

## 2021-03-14 LAB — CBC WITH DIFFERENTIAL/PLATELET
Absolute Monocytes: 348 cells/uL (ref 200–950)
Basophils Absolute: 21 cells/uL (ref 0–200)
Basophils Relative: 0.3 %
Eosinophils Absolute: 149 cells/uL (ref 15–500)
Eosinophils Relative: 2.1 %
HCT: 39.8 % (ref 35.0–45.0)
Hemoglobin: 13 g/dL (ref 11.7–15.5)
Lymphs Abs: 2251 cells/uL (ref 850–3900)
MCH: 31.3 pg (ref 27.0–33.0)
MCHC: 32.7 g/dL (ref 32.0–36.0)
MCV: 95.9 fL (ref 80.0–100.0)
MPV: 11.3 fL (ref 7.5–12.5)
Monocytes Relative: 4.9 %
Neutro Abs: 4331 cells/uL (ref 1500–7800)
Neutrophils Relative %: 61 %
Platelets: 338 10*3/uL (ref 140–400)
RBC: 4.15 10*6/uL (ref 3.80–5.10)
RDW: 12.7 % (ref 11.0–15.0)
Total Lymphocyte: 31.7 %
WBC: 7.1 10*3/uL (ref 3.8–10.8)

## 2021-03-14 LAB — BASIC METABOLIC PANEL
BUN: 12 mg/dL (ref 7–25)
CO2: 24 mmol/L (ref 20–32)
Calcium: 9.3 mg/dL (ref 8.6–10.2)
Chloride: 105 mmol/L (ref 98–110)
Creat: 0.73 mg/dL (ref 0.50–0.97)
Glucose, Bld: 83 mg/dL (ref 65–99)
Potassium: 4 mmol/L (ref 3.5–5.3)
Sodium: 140 mmol/L (ref 135–146)

## 2021-03-14 LAB — BRAIN NATRIURETIC PEPTIDE: Brain Natriuretic Peptide: 9 pg/mL (ref <100)

## 2021-03-14 LAB — TSH: TSH: 3.06 mIU/L

## 2021-03-23 ENCOUNTER — Ambulatory Visit (INDEPENDENT_AMBULATORY_CARE_PROVIDER_SITE_OTHER): Payer: BC Managed Care – PPO | Admitting: Student

## 2021-03-23 ENCOUNTER — Other Ambulatory Visit: Payer: Self-pay

## 2021-03-23 DIAGNOSIS — R0600 Dyspnea, unspecified: Secondary | ICD-10-CM | POA: Diagnosis not present

## 2021-03-23 DIAGNOSIS — R0609 Other forms of dyspnea: Secondary | ICD-10-CM

## 2021-03-23 LAB — PULMONARY FUNCTION TEST
DL/VA % pred: 155 %
DL/VA: 6.86 ml/min/mmHg/L
DLCO cor % pred: 105 %
DLCO cor: 25.95 ml/min/mmHg
DLCO unc % pred: 104 %
DLCO unc: 25.62 ml/min/mmHg
FEF 25-75 Post: 1.54 L/sec
FEF 25-75 Pre: 0.76 L/sec
FEF2575-%Change-Post: 103 %
FEF2575-%Pred-Post: 47 %
FEF2575-%Pred-Pre: 23 %
FEV1-%Change-Post: 36 %
FEV1-%Pred-Post: 55 %
FEV1-%Pred-Pre: 40 %
FEV1-Post: 1.61 L
FEV1-Pre: 1.18 L
FEV1FVC-%Change-Post: 10 %
FEV1FVC-%Pred-Pre: 72 %
FEV6-%Change-Post: 22 %
FEV6-%Pred-Post: 68 %
FEV6-%Pred-Pre: 55 %
FEV6-Post: 2.36 L
FEV6-Pre: 1.92 L
FEV6FVC-%Pred-Post: 101 %
FEV6FVC-%Pred-Pre: 101 %
FVC-%Change-Post: 23 %
FVC-%Pred-Post: 67 %
FVC-%Pred-Pre: 54 %
FVC-Post: 2.38 L
FVC-Pre: 1.92 L
Post FEV1/FVC ratio: 68 %
Post FEV6/FVC ratio: 100 %
Pre FEV1/FVC ratio: 61 %
Pre FEV6/FVC Ratio: 100 %
RV % pred: 167 %
RV: 2.73 L
TLC % pred: 92 %
TLC: 5.07 L

## 2021-03-23 NOTE — Patient Instructions (Signed)
Full PFT performed today. °

## 2021-03-23 NOTE — Progress Notes (Signed)
Full PFT performed today. °

## 2021-03-24 ENCOUNTER — Telehealth: Payer: Self-pay | Admitting: Student

## 2021-03-24 MED ORDER — BUDESONIDE-FORMOTEROL FUMARATE 80-4.5 MCG/ACT IN AERO: 2.0000 | INHALATION_SPRAY | Freq: Two times a day (BID) | RESPIRATORY_TRACT | 12 refills | Status: DC

## 2021-03-24 NOTE — Telephone Encounter (Signed)
Called to review PFTs which are supportive of asthma. Will start symbicort 80 2 puffs BID through spacer which she already has at home. Discussed inhaler technique.  Lar[facility]

## 2021-04-18 ENCOUNTER — Other Ambulatory Visit: Payer: Self-pay

## 2021-04-18 ENCOUNTER — Encounter: Payer: Self-pay | Admitting: General Practice

## 2021-04-18 ENCOUNTER — Ambulatory Visit
Admission: EM | Admit: 2021-04-18 | Discharge: 2021-04-18 | Disposition: A | Discharge status: Home / Self Care | Payer: BC Managed Care – PPO | Attending: Physician Assistant | Admitting: Physician Assistant

## 2021-04-18 ENCOUNTER — Ambulatory Visit
Admission: RE | Admit: 2021-04-18 | Discharge: 2021-04-18 | Disposition: A | Discharge status: Home / Self Care | Payer: BC Managed Care – PPO | Source: Ambulatory Visit

## 2021-04-18 DIAGNOSIS — J302 Other seasonal allergic rhinitis: Secondary | ICD-10-CM | POA: Diagnosis not present

## 2021-04-18 MED ORDER — PREDNISONE 20 MG PO TABS: 40.0000 mg | ORAL_TABLET | Freq: Every day | ORAL | 0 refills | Status: AC

## 2021-04-18 NOTE — ED Triage Notes (Signed)
Nasal congestion, sore throat, post nasal drips, slight cough, clear greenish mucus, took OTC muninex, delsym cough syrup with little little, no fever. Has a headaches 6/10 frontal part.

## 2021-04-18 NOTE — ED Provider Notes (Signed)
[name]    CSN: [id number] Arrival date & time: 04/18/21  0956      History   Chief Complaint Chief Complaint  Patient presents with   Facial Pain    HPI Danielle [name] is a 35 y.o. female.   Patient here today for evaluation of nasal congestion PND, mild cough and sinus pressure that started about 4 days ago.  She reports that she does have seasonal allergies and her symptoms feel the same.  She has tried multiple over-the-counter treatments including Mucinex, Delsym without any significant relief.  The history is provided by the patient.   Past Medical History:  Diagnosis Date   Asthma     There are no problems to display for this patient.   History reviewed. No pertinent surgical history.  OB History   No obstetric history on file.      Home Medications    Prior to Admission medications   Medication Sig Start Date End Date Taking? Authorizing Provider  albuterol (VENTOLIN HFA) 108 (90 Base) MCG/ACT inhaler Inhale into the lungs. 09/03/15  Yes [provider]  budesonide-formoterol (SYMBICORT) 80-4.5 MCG/ACT inhaler Inhale 2 puffs into the lungs in the morning and at bedtime. 03/24/21  Yes Omar Person, MD  pantoprazole (PROTONIX) 40 MG tablet Take 1 tablet (40 mg total) by mouth daily. 03/13/21  Yes Omar Person, MD  predniSONE (DELTASONE) 20 MG tablet Take 2 tablets (40 mg total) by mouth daily with breakfast for 5 days. 04/18/21 04/23/21 Yes Tomi Bamberger, PA-C  neomycin-polymyxin-hydrocortisone (CORTISPORIN) 3.5-10000-1 OTIC suspension Place 3 drops into the left ear 4 (four) times daily. 11/28/20   Wieters, Hallie C, PA-C  famotidine (PEPCID) 10 MG tablet Take 10 mg by mouth 2 (two) times daily.  03/22/20  [provider]    Family History Family History  Problem Relation Age of Onset   Hypertension Mother    Healthy Father     Social History Social History   Tobacco Use   Smoking status: Former     Packs/day: 0.10    Years: 5.00    Pack years: 0.50    Types: Cigarettes    Quit date: 2009    Years since quitting: 13.7   Smokeless tobacco: Never  Substance Use Topics   Alcohol use: Yes    Alcohol/week: 1.0 standard drink    Types: 1 Cans of beer per week    Comment: every other day   Drug use: No     Allergies   Sulfa antibiotics   Review of Systems Review of Systems  Constitutional:  Negative for chills and fever.  HENT:  Positive for congestion, sinus pressure and sore throat. Negative for ear pain.   Eyes:  Negative for discharge and redness.  Respiratory:  Positive for cough. Negative for shortness of breath.   Gastrointestinal:  Negative for nausea and vomiting.    Physical Exam Triage Vital Signs ED Triage Vitals [04/18/21 1019]  Enc Vitals Group     BP (!) 138/108     Pulse Rate 78     Resp 18     Temp 98.1 F (36.7 C)     Temp Source Oral     SpO2 97 %     Weight      Height      Head Circumference      Peak Flow      Pain Score 6     Pain Loc  Pain Edu?      Excl. in GC?    No data found.  Updated Vital Signs BP (!) 138/108 (BP Location: Left Arm)   Pulse 78   Temp 98.1 F (36.7 C) (Oral)   Resp 18   LMP  (LMP Unknown)   SpO2 97%      Physical Exam Vitals and nursing note reviewed.  Constitutional:      General: She is not in acute distress.    Appearance: Normal appearance. She is not ill-appearing.  HENT:     Head: Normocephalic and atraumatic.     Right Ear: Tympanic membrane normal.     Left Ear: Tympanic membrane normal.     Nose: Congestion present.     Mouth/Throat:     Mouth: Mucous membranes are moist.     Pharynx: Oropharynx is clear. No oropharyngeal exudate or posterior oropharyngeal erythema.  Eyes:     Conjunctiva/sclera: Conjunctivae normal.  Cardiovascular:     Rate and Rhythm: Normal rate and regular rhythm.     Heart sounds: Normal heart sounds. No murmur heard. Pulmonary:     Effort: Pulmonary effort  is normal. No respiratory distress.     Breath sounds: Normal breath sounds. No wheezing, rhonchi or rales.  Skin:    General: Skin is warm and dry.  Neurological:     Mental Status: She is alert.  Psychiatric:        Mood and Affect: Mood normal.        Behavior: Behavior normal.        Thought Content: Thought content normal.     UC Treatments / Results  Labs (all labs ordered are listed, but only abnormal results are displayed) Labs Reviewed - No data to display  EKG   Radiology No results found.  Procedures Procedures (including critical care time)  Medications Ordered in UC Medications - No data to display  Initial Impression / Assessment and Plan / UC Course  I have reviewed the triage vital signs and the nursing notes.  Pertinent labs & imaging results that were available during my care of the patient were reviewed by me and considered in my medical decision making (see chart for details).  Suspect likely allergic rhinitis flare.  Will treat with short course of steroids and encouraged follow-up with any further concerns or symptoms worsen anyway.  Final Clinical Impressions(s) / UC Diagnoses   Final diagnoses:  Seasonal allergic rhinitis, unspecified trigger   Discharge Instructions   None    ED Prescriptions     Medication Sig Dispense Auth. Provider   predniSONE (DELTASONE) 20 MG tablet Take 2 tablets (40 mg total) by mouth daily with breakfast for 5 days. 10 tablet Tomi Bamberger, PA-C      PDMP not reviewed this encounter.   Tomi Bamberger, PA-C 04/18/21 1049

## 2021-05-12 DIAGNOSIS — Z23 Encounter for immunization: Secondary | ICD-10-CM | POA: Diagnosis not present

## 2021-06-09 ENCOUNTER — Other Ambulatory Visit: Payer: Self-pay | Admitting: Student

## 2021-06-15 DIAGNOSIS — Z113 Encounter for screening for infections with a predominantly sexual mode of transmission: Secondary | ICD-10-CM | POA: Diagnosis not present

## 2021-06-15 DIAGNOSIS — Z6841 Body Mass Index (BMI) 40.0 and over, adult: Secondary | ICD-10-CM | POA: Diagnosis not present

## 2021-06-15 DIAGNOSIS — Z01419 Encounter for gynecological examination (general) (routine) without abnormal findings: Secondary | ICD-10-CM | POA: Diagnosis not present

## 2021-06-18 ENCOUNTER — Ambulatory Visit (HOSPITAL_COMMUNITY): Payer: BC Managed Care – PPO

## 2021-06-21 ENCOUNTER — Other Ambulatory Visit: Payer: Self-pay

## 2021-06-21 ENCOUNTER — Encounter: Payer: Self-pay | Admitting: *Deleted

## 2021-06-21 ENCOUNTER — Ambulatory Visit
Admission: EM | Admit: 2021-06-21 | Discharge: 2021-06-21 | Disposition: A | Discharge status: Home / Self Care | Payer: BC Managed Care – PPO | Attending: Internal Medicine | Admitting: Internal Medicine

## 2021-06-21 DIAGNOSIS — H65194 Other acute nonsuppurative otitis media, recurrent, right ear: Secondary | ICD-10-CM | POA: Diagnosis not present

## 2021-06-21 MED ORDER — AMOXICILLIN 875 MG PO TABS: 875.0000 mg | ORAL_TABLET | Freq: Two times a day (BID) | ORAL | 0 refills | Status: AC

## 2021-06-21 NOTE — ED Triage Notes (Signed)
Pt reports Rt ear pain for 2 days.

## 2021-06-21 NOTE — Discharge Instructions (Signed)
You have an ear infection that is being treated with antibiotic.  Please follow-up if symptoms persist or worsen.

## 2021-06-21 NOTE — ED Provider Notes (Signed)
[name]    CSN: [id number] Arrival date & time: 06/21/21  1443      History   Chief Complaint Chief Complaint  Patient presents with   Otalgia    RT    HPI Danielle [name] is a 35 y.o. female.   Patient presents with right ear pain that started approximately 2 days ago.  Denies any upper respiratory symptoms or fever.  Denies any trauma or foreign bodies to the ear.  Patient reports that she has recurrent ear infections and this "feels similar".  Patient has not taken any medications to help alleviate symptoms.   Otalgia  Past Medical History:  Diagnosis Date   Asthma     There are no problems to display for this patient.   History reviewed. No pertinent surgical history.  OB History   No obstetric history on file.      Home Medications    Prior to Admission medications   Medication Sig Start Date End Date Taking? Authorizing Provider  amoxicillin (AMOXIL) 875 MG tablet Take 1 tablet (875 mg total) by mouth 2 (two) times daily for 10 days. 06/21/21 07/01/21 Yes Juni Glaab, Danielle Fredrickson, FNP  albuterol (VENTOLIN HFA) 108 (90 Base) MCG/ACT inhaler Inhale into the lungs. 09/03/15   [provider]  budesonide-formoterol (SYMBICORT) 80-4.5 MCG/ACT inhaler Inhale 2 puffs into the lungs in the morning and at bedtime. 03/24/21   Omar Person, MD  neomycin-polymyxin-hydrocortisone (CORTISPORIN) 3.5-10000-1 OTIC suspension Place 3 drops into the left ear 4 (four) times daily. 11/28/20   Wieters, Hallie C, PA-C  pantoprazole (PROTONIX) 40 MG tablet TAKE 1 TABLET BY MOUTH EVERY DAY 06/10/21   Omar Person, MD  famotidine (PEPCID) 10 MG tablet Take 10 mg by mouth 2 (two) times daily.  03/22/20  [provider]    Family History Family History  Problem Relation Age of Onset   Hypertension Mother    Healthy Father     Social History Social History   Tobacco Use   Smoking status: Former    Packs/day: 0.10    Years: 5.00    Pack  years: 0.50    Types: Cigarettes    Quit date: 2009    Years since quitting: 13.9   Smokeless tobacco: Never  Substance Use Topics   Alcohol use: Yes    Alcohol/week: 1.0 standard drink    Types: 1 Cans of beer per week    Comment: every other day   Drug use: No     Allergies   Sulfa antibiotics   Review of Systems Review of Systems Per HPI  Physical Exam Triage Vital Signs ED Triage Vitals  Enc Vitals Group     BP 06/21/21 1553 (!) 143/101     Pulse Rate 06/21/21 1553 81     Resp 06/21/21 1553 18     Temp 06/21/21 1553 98.6 F (37 C)     Temp src --      SpO2 06/21/21 1553 96 %     Weight --      Height --      Head Circumference --      Peak Flow --      Pain Score 06/21/21 1551 7     Pain Loc --      Pain Edu? --      Excl. in GC? --    No data found.  Updated Vital Signs BP (!) 143/101    Pulse 81    Temp  98.6 F (37 C)    Resp 18    SpO2 96%   Visual Acuity Right Eye Distance:   Left Eye Distance:   Bilateral Distance:    Right Eye Near:   Left Eye Near:    Bilateral Near:     Physical Exam Constitutional:      General: She is not in acute distress.    Appearance: Normal appearance. She is not toxic-appearing or diaphoretic.  HENT:     Head: Normocephalic and atraumatic.     Right Ear: Ear canal and external ear normal. No mastoid tenderness. Tympanic membrane is erythematous and bulging. Tympanic membrane is not perforated.     Left Ear: Tympanic membrane, ear canal and external ear normal.  Eyes:     Extraocular Movements: Extraocular movements intact.     Conjunctiva/sclera: Conjunctivae normal.  Pulmonary:     Effort: Pulmonary effort is normal.  Neurological:     General: No focal deficit present.     Mental Status: She is alert and oriented to person, place, and time. Mental status is at baseline.  Psychiatric:        Mood and Affect: Mood normal.        Behavior: Behavior normal.        Thought Content: Thought content normal.         Judgment: Judgment normal.     UC Treatments / Results  Labs (all labs ordered are listed, but only abnormal results are displayed) Labs Reviewed - No data to display  EKG   Radiology No results found.  Procedures Procedures (including critical care time)  Medications Ordered in UC Medications - No data to display  Initial Impression / Assessment and Plan / UC Course  I have reviewed the triage vital signs and the nursing notes.  Pertinent labs & imaging results that were available during my care of the patient were reviewed by me and considered in my medical decision making (see chart for details).     Will treat right otitis media with amoxicillin antibiotic.  No red flags on exam.  Discussed strict return precautions.  Patient verbalized understanding and was agreeable with plan. Final Clinical Impressions(s) / UC Diagnoses   Final diagnoses:  Other recurrent acute nonsuppurative otitis media of right ear     Discharge Instructions      You have an ear infection that is being treated with antibiotic.  Please follow-up if symptoms persist or worsen.    ED Prescriptions     Medication Sig Dispense Auth. Provider   amoxicillin (AMOXIL) 875 MG tablet Take 1 tablet (875 mg total) by mouth 2 (two) times daily for 10 days. 20 tablet Danielle [name], Danielle [name], [place]      PDMP not reviewed this encounter.   Gustavus Bryant, [place] 06/21/21 1616

## 2021-07-01 NOTE — Progress Notes (Signed)
Synopsis: Referred for asthma by self  Subjective:   PATIENT ID: Danielle [name] GENDER: female DOB: August 22, 1985, MRN: [id number]  Chief Complaint  Patient presents with   Follow-up    Ran out of of her Symbicort and Albuterol about 6 wks ago and her breathing has been worse since. She has been wheezing some. Denies any cough or chest tightness.    35yF with history of asthma and recent covid-19 infection presents to establish care for asthma.  Regarding her asthma:  She has tried albuterol only. She is pretty sure it helps when she feels quite dyspneic. Never has been hospitalized for asthma. SHe has been given steroids for asthma, previously these courses have improved her asthma. This past time when she was given them for covid it wasn't really helpful. Her asthma symptoms are primarily dyspnea with exertion, chest tightness. Little in way of cough. She frequently has sinonasal congestion and PND - switches between zyrtec and xyzal - some seasonal component to it, worse in spring. Takes pepcid or zantac as needed for reflux. Cutting back on soft drinks helps some with GERD symptoms.  She has no photophobia, rashes.   She doesn't snore. No paroxysmal nocturnal dyspnea. Perhaps mild excessive daytime sleepiness.   Mom has sarcoidosis - she did require treatment in the past, had ocular involvement.  She works as an Dance movement psychotherapist at [employer]. No occupational exposures to dusts/solvents without a mask. Never has lived outside of [place]. She has no pets citing allergy to cats, dogs - she had a dog in college and this was the context in which she discovered she had asthma.   Some cigarettes/MJ in remote past.   Interval HPI: Started on PPI at last visit. PFTs since last visit suggestive of asthma with moderately severe obstruction, started on symbicort. Had visit to UC for rhinitis and got course of prednisone 04/18/21, course of amoxicillin for AOM 12/28.  Has been off of symbicort  for about a month. It really did make big difference for her. PPI has made difference as well   Otherwise pertinent review of systems is negative.    Past Medical History:  Diagnosis Date   Asthma      Family History  Problem Relation Age of Onset   Hypertension Mother    Healthy Father      No past surgical history on file.  Social History   Socioeconomic History   Marital status: Single    Spouse name: Not on file   Number of children: Not on file   Years of education: Not on file   Highest education level: Not on file  Occupational History   Not on file  Tobacco Use   Smoking status: Former    Packs/day: 0.10    Years: 5.00    Pack years: 0.50    Types: Cigarettes    Quit date: 2009    Years since quitting: 14.0   Smokeless tobacco: Never  Substance and Sexual Activity   Alcohol use: Yes    Alcohol/week: 1.0 standard drink    Types: 1 Cans of beer per week    Comment: every other day   Drug use: No   Sexual activity: Yes    Birth control/protection: Implant  Other Topics Concern   Not on file  Social History Narrative   Not on file   Social Determinants of Health   Financial Resource Strain: Not on file  Food Insecurity: Not on file  Transportation Needs:  Not on file  Physical Activity: Not on file  Stress: Not on file  Social Connections: Not on file  Intimate Partner Violence: Not on file     Allergies  Allergen Reactions   Sulfa Antibiotics Rash     Outpatient Medications Prior to Visit  Medication Sig Dispense Refill   pantoprazole (PROTONIX) 40 MG tablet TAKE 1 TABLET BY MOUTH EVERY DAY 90 tablet 0   albuterol (VENTOLIN HFA) 108 (90 Base) MCG/ACT inhaler Inhale into the lungs. (Patient not taking: Reported on 07/02/2021)     budesonide-formoterol (SYMBICORT) 80-4.5 MCG/ACT inhaler Inhale 2 puffs into the lungs in the morning and at bedtime. (Patient not taking: Reported on 07/02/2021) 1 each 12   neomycin-polymyxin-hydrocortisone  (CORTISPORIN) 3.5-10000-1 OTIC suspension Place 3 drops into the left ear 4 (four) times daily. 10 mL 0   No facility-administered medications prior to visit.       Objective:   Physical Exam:  General appearance: 35 y.o., female, NAD, conversant  Eyes: anicteric sclerae; PERRL, tracking appropriately HENT: NCAT; MMM Neck: Trachea midline; no lymphadenopathy, no JVD Lungs: CTAB, no crackles, no wheeze, with normal respiratory effort CV: RRR, no murmur  Abdomen: Soft, non-tender; non-distended, BS present  Extremities: No peripheral edema, warm Skin: Normal turgor and texture; no rash Psych: Appropriate affect Neuro: Alert and oriented to person and place, no focal deficit     Vitals:   07/02/21 1548  BP: 128/80  Pulse: 77  Temp: 98.1 F (36.7 C)  TempSrc: Oral  SpO2: 98%  Weight: (!) 418 lb (189.6 kg)  Height: 5\' 7"  (1.702 m)    98% on RA BMI Readings from Last 3 Encounters:  07/02/21 65.47 kg/m  03/13/21 64.15 kg/m   Wt Readings from Last 3 Encounters:  07/02/21 (!) 418 lb (189.6 kg)  03/13/21 (!) 409 lb 9.6 oz (185.8 kg)     CBC    Component Value Date/Time   WBC 7.1 03/13/2021 1530   RBC 4.15 03/13/2021 1530   HGB 13.0 03/13/2021 1530   HCT 39.8 03/13/2021 1530   PLT 338 03/13/2021 1530   MCV 95.9 03/13/2021 1530   MCH 31.3 03/13/2021 1530   MCHC 32.7 03/13/2021 1530   RDW 12.7 03/13/2021 1530   LYMPHSABS 2,251 03/13/2021 1530   EOSABS 149 03/13/2021 1530   BASOSABS 21 03/13/2021 1530     Chest Imaging:  OSH CXR report 09/2014 reviewed by me and unremarkable    Pulmonary Functions Testing Results: PFT Results Latest Ref Rng & Units 03/23/2021  FVC-Pre L 1.92  FVC-Predicted Pre % 54  FVC-Post L 2.38  FVC-Predicted Post % 67  Pre FEV1/FVC % % 61  Post FEV1/FCV % % 68  FEV1-Pre L 1.18  FEV1-Predicted Pre % 40  FEV1-Post L 1.61  DLCO uncorrected ml/min/mmHg 25.62  DLCO UNC% % 104  DLCO corrected ml/min/mmHg 25.95  DLCO COR  %Predicted % 105  DLVA Predicted % 155  TLC L 5.07  TLC % Predicted % 92  RV % Predicted % 167   Reviewed by me remarkable for moderately severe obstruction, air trapping, remarkable BD response, and normal DLCO    Assessment & Plan:   # Moderate persistent asthma  triggers could be rhinitis/PND, GERD.    Plan: - cbc/diff, IgE if control challenging despite below - continue symbicort 80 2 puffs BID with spacer - HRCT Chest given family history of sarcoid and her steroid responsiveness - discuss sleep study at next visit - continue ppi 40 mg  daily before breakfast for now  RTC may   Omar Person, MD [facility] 07/02/2021 4:20 PM

## 2021-07-02 ENCOUNTER — Ambulatory Visit (INDEPENDENT_AMBULATORY_CARE_PROVIDER_SITE_OTHER): Payer: BC Managed Care – PPO | Admitting: Student

## 2021-07-02 ENCOUNTER — Encounter: Payer: Self-pay | Admitting: Student

## 2021-07-02 ENCOUNTER — Other Ambulatory Visit: Payer: Self-pay

## 2021-07-02 VITALS — BP 128/80 | HR 77 | Temp 98.1°F | Ht 67.0 in | Wt >= 6400 oz

## 2021-07-02 DIAGNOSIS — J4540 Moderate persistent asthma, uncomplicated: Secondary | ICD-10-CM

## 2021-07-02 DIAGNOSIS — R0609 Other forms of dyspnea: Secondary | ICD-10-CM | POA: Diagnosis not present

## 2021-07-02 MED ORDER — ALBUTEROL SULFATE HFA 108 (90 BASE) MCG/ACT IN AERS: 1.0000 | INHALATION_SPRAY | Freq: Four times a day (QID) | RESPIRATORY_TRACT | 11 refills | Status: DC | PRN

## 2021-07-02 MED ORDER — PANTOPRAZOLE SODIUM 40 MG PO TBEC: 40.0000 mg | DELAYED_RELEASE_TABLET | Freq: Every day | ORAL | 1 refills | Status: DC

## 2021-07-02 MED ORDER — BUDESONIDE-FORMOTEROL FUMARATE 80-4.5 MCG/ACT IN AERO: 2.0000 | INHALATION_SPRAY | Freq: Two times a day (BID) | RESPIRATORY_TRACT | 12 refills | Status: DC

## 2021-07-02 NOTE — Patient Instructions (Addendum)
-   Symbicort 2 puffs twice daily with spacer, rinse mouth afterward - protonix, albuterol refilled - CT Chest, make sure there's no evidence of sarcoid - They will call you to set up appointment in May - see you then!

## 2021-07-08 ENCOUNTER — Inpatient Hospital Stay: Admission: RE | Admit: 2021-07-08 | Payer: BC Managed Care – PPO | Source: Ambulatory Visit

## 2021-07-10 DIAGNOSIS — J3089 Other allergic rhinitis: Secondary | ICD-10-CM | POA: Diagnosis not present

## 2021-07-10 DIAGNOSIS — J4540 Moderate persistent asthma, uncomplicated: Secondary | ICD-10-CM | POA: Diagnosis not present

## 2021-07-10 DIAGNOSIS — Z882 Allergy status to sulfonamides status: Secondary | ICD-10-CM | POA: Diagnosis not present

## 2021-07-10 DIAGNOSIS — J309 Allergic rhinitis, unspecified: Secondary | ICD-10-CM | POA: Insufficient documentation

## 2021-07-20 ENCOUNTER — Other Ambulatory Visit: Payer: Self-pay

## 2021-07-20 ENCOUNTER — Ambulatory Visit (INDEPENDENT_AMBULATORY_CARE_PROVIDER_SITE_OTHER)
Admission: RE | Admit: 2021-07-20 | Discharge: 2021-07-20 | Disposition: A | Discharge status: Home / Self Care | Payer: BC Managed Care – PPO | Source: Ambulatory Visit | Attending: Student | Admitting: Student

## 2021-07-20 DIAGNOSIS — R0609 Other forms of dyspnea: Secondary | ICD-10-CM | POA: Diagnosis not present

## 2021-07-20 DIAGNOSIS — D869 Sarcoidosis, unspecified: Secondary | ICD-10-CM | POA: Diagnosis not present

## 2021-07-20 DIAGNOSIS — R0602 Shortness of breath: Secondary | ICD-10-CM | POA: Diagnosis not present

## 2021-09-22 ENCOUNTER — Other Ambulatory Visit: Payer: Self-pay

## 2021-09-22 ENCOUNTER — Ambulatory Visit
Admission: RE | Admit: 2021-09-22 | Discharge: 2021-09-22 | Disposition: A | Discharge status: Home / Self Care | Payer: BC Managed Care – PPO | Source: Ambulatory Visit | Attending: Internal Medicine | Admitting: Internal Medicine

## 2021-09-22 VITALS — BP 142/87 | HR 95 | Temp 98.7°F | Resp 18

## 2021-09-22 DIAGNOSIS — H65194 Other acute nonsuppurative otitis media, recurrent, right ear: Secondary | ICD-10-CM

## 2021-09-22 MED ORDER — CIPROFLOXACIN-DEXAMETHASONE 0.3-0.1 % OT SUSP: 4.0000 [drp] | Freq: Two times a day (BID) | OTIC | 0 refills | Status: AC

## 2021-09-22 MED ORDER — AMOXICILLIN 875 MG PO TABS: 875.0000 mg | ORAL_TABLET | Freq: Two times a day (BID) | ORAL | 0 refills | Status: AC

## 2021-09-22 NOTE — Discharge Instructions (Signed)
You have an ear infection which is being treated with an antibiotic and eardrops.  Please follow-up if symptoms persist or worsen. ?

## 2021-09-22 NOTE — ED Provider Notes (Signed)
?[name] ? ? ? ?CSN: [id number] ?Arrival date & time: 09/22/21  1549 ? ? ?  ? ?History   ?Chief Complaint ?Chief Complaint  ?Patient presents with  ? Ear Fullness  ? Appointment  ?  1600  ? ? ?HPI ?Danielle [name] is a 36 y.o. female.  ? ?Patient presents with right ear pain and feelings of fullness that started approximately 2 days ago.  Patient denies any associated upper respiratory symptoms or fever.  Denies trauma, foreign body, drainage, decreased hearing.  Patient reports that she did wash her hair in the sink and may have gotten water in her ear but is not sure. ? ? ?Ear Fullness ? ? ?Past Medical History:  ?Diagnosis Date  ? Asthma   ? ? ?There are no problems to display for this patient. ? ? ?History reviewed. No pertinent surgical history. ? ?OB History   ?No obstetric history on file. ?  ? ? ? ?Home Medications   ? ?Prior to Admission medications   ?Medication Sig Start Date End Date Taking? Authorizing Provider  ?amoxicillin (AMOXIL) 875 MG tablet Take 1 tablet (875 mg total) by mouth 2 (two) times daily for 10 days. 09/22/21 10/02/21 Yes Gustavus Bryant, FNP  ?ciprofloxacin-dexamethasone (CIPRODEX) OTIC suspension Place 4 drops into the right ear 2 (two) times daily for 7 days. 09/22/21 09/29/21 Yes Victoriana Aziz, Acie Fredrickson, FNP  ?albuterol (VENTOLIN HFA) 108 (90 Base) MCG/ACT inhaler Inhale 1-2 puffs into the lungs every 6 (six) hours as needed for wheezing or shortness of breath. 07/02/21   Omar Person, MD  ?budesonide-formoterol [facility]) 80-4.5 MCG/ACT inhaler Inhale 2 puffs into the lungs in the morning and at bedtime. 07/02/21   Omar Person, MD  ?pantoprazole (PROTONIX) 40 MG tablet Take 1 tablet (40 mg total) by mouth daily. 30 minutes before breakfast 07/02/21   Omar Person, MD  ?famotidine (PEPCID) 10 MG tablet Take 10 mg by mouth 2 (two) times daily.  03/22/20  [provider]  ? ? ?Family History ?Family History  ?Problem Relation Age of Onset  ? Hypertension  Mother   ? Healthy Father   ? ? ?Social History ?Social History  ? ?Tobacco Use  ? Smoking status: Former  ?  Packs/day: 0.10  ?  Years: 5.00  ?  Pack years: 0.50  ?  Types: Cigarettes  ?  Quit date: 2009  ?  Years since quitting: 14.2  ? Smokeless tobacco: Never  ?Substance Use Topics  ? Alcohol use: Yes  ?  Alcohol/week: 1.0 standard drink  ?  Types: 1 Cans of beer per week  ?  Comment: every other day  ? Drug use: No  ? ? ? ?Allergies   ?Sulfa antibiotics ? ? ?Review of Systems ?Review of Systems ?Per HPI ? ?Physical Exam ?Triage Vital Signs ?ED Triage Vitals  ?Enc Vitals Group  ?   BP 09/22/21 1636 (!) 142/87  ?   Pulse Rate 09/22/21 1636 95  ?   Resp 09/22/21 1636 18  ?   Temp 09/22/21 1636 98.7 ?F (37.1 ?C)  ?   Temp Source 09/22/21 1636 Oral  ?   SpO2 09/22/21 1636 95 %  ?   Weight --   ?   Height --   ?   Head Circumference --   ?   Peak Flow --   ?   Pain Score 09/22/21 1550 2  ?   Pain Loc --   ?  Pain Edu? --   ?   Excl. in GC? --   ? ?No data found. ? ?Updated Vital Signs ?BP (!) 142/87 (BP Location: Left Arm)   Pulse 95   Temp 98.7 ?F (37.1 ?C) (Oral)   Resp 18   SpO2 95%  ? ?Visual Acuity ?Right Eye Distance:   ?Left Eye Distance:   ?Bilateral Distance:   ? ?Right Eye Near:   ?Left Eye Near:    ?Bilateral Near:    ? ?Physical Exam ?Constitutional:   ?   General: She is not in acute distress. ?   Appearance: Normal appearance. She is not toxic-appearing or diaphoretic.  ?HENT:  ?   Head: Normocephalic and atraumatic.  ?   Right Ear: External ear normal. No drainage, swelling or tenderness. No middle ear effusion. There is no impacted cerumen. Tympanic membrane is erythematous. Tympanic membrane is not perforated or bulging.  ?   Ears:  ?   Comments: Mild erythema and mild swelling located to right external canal. ?Eyes:  ?   Extraocular Movements: Extraocular movements intact.  ?   Conjunctiva/sclera: Conjunctivae normal.  ?Pulmonary:  ?   Effort: Pulmonary effort is normal.  ?Neurological:  ?    General: No focal deficit present.  ?   Mental Status: She is alert and oriented to person, place, and time. Mental status is at baseline.  ?Psychiatric:     ?   Mood and Affect: Mood normal.     ?   Behavior: Behavior normal.     ?   Thought Content: Thought content normal.     ?   Judgment: Judgment normal.  ? ? ? ?UC Treatments / Results  ?Labs ?(all labs ordered are listed, but only abnormal results are displayed) ?Labs Reviewed - No data to display ? ?EKG ? ? ?Radiology ?No results found. ? ?Procedures ?Procedures (including critical care time) ? ?Medications Ordered in UC ?Medications - No data to display ? ?Initial Impression / Assessment and Plan / UC Course  ?I have reviewed the triage vital signs and the nursing notes. ? ?Pertinent labs & imaging results that were available during my care of the patient were reviewed by me and considered in my medical decision making (see chart for details). ? ?  ? ?Amoxicillin to treat right otitis media.  There also appears to be infection to external canal so will treat with Ciprodex antibiotic drops.  Discussed return precautions.  Patient verbalized understanding and was agreeable with plan. ?Final Clinical Impressions(s) / UC Diagnoses  ? ?Final diagnoses:  ?Other recurrent acute nonsuppurative otitis media of right ear  ? ? ? ?Discharge Instructions   ? ?  ?You have an ear infection which is being treated with an antibiotic and eardrops.  Please follow-up if symptoms persist or worsen. ? ? ? ?ED Prescriptions   ? ? Medication Sig Dispense Auth. Provider  ? amoxicillin (AMOXIL) 875 MG tablet Take 1 tablet (875 mg total) by mouth 2 (two) times daily for 10 days. 10 tablet Gustavus Bryant, [place]  ? ciprofloxacin-dexamethasone (CIPRODEX) OTIC suspension Place 4 drops into the right ear 2 (two) times daily for 7 days. 7.5 mL Gustavus Bryant, [place]  ? ?  ? ?PDMP not reviewed this encounter. ?  ?Gustavus Bryant, [place] ?09/22/21 1725 ? ?

## 2021-09-22 NOTE — ED Triage Notes (Signed)
Pt here for right ear fullness x 2 days ?

## 2021-10-15 ENCOUNTER — Ambulatory Visit (INDEPENDENT_AMBULATORY_CARE_PROVIDER_SITE_OTHER): Payer: BC Managed Care – PPO | Admitting: Student

## 2021-10-15 ENCOUNTER — Encounter: Payer: Self-pay | Admitting: Student

## 2021-10-15 VITALS — BP 136/82 | HR 86 | Temp 98.3°F | Ht 67.0 in | Wt >= 6400 oz

## 2021-10-15 DIAGNOSIS — K219 Gastro-esophageal reflux disease without esophagitis: Secondary | ICD-10-CM

## 2021-10-15 DIAGNOSIS — J4530 Mild persistent asthma, uncomplicated: Secondary | ICD-10-CM | POA: Diagnosis not present

## 2021-10-15 NOTE — Progress Notes (Signed)
? ?Synopsis: Referred for asthma by self ? ?Subjective:  ? ?PATIENT ID: Danielle [name] GENDER: female DOB: February 15, 1986, MRN: [id number] ? ?Chief Complaint  ?Patient presents with  ? Follow-up  ?  No c/o   ? ?35yF with history of asthma and recent covid-19 infection presents to establish care for asthma. ? ?Regarding her asthma:  ?She has tried albuterol only. She is pretty sure it helps when she feels quite dyspneic. Never has been hospitalized for asthma. SHe has been given steroids for asthma, previously these courses have improved her asthma. This past time when she was given them for covid it wasn't really helpful. Her asthma symptoms are primarily dyspnea with exertion, chest tightness. Little in way of cough. She frequently has sinonasal congestion and PND - switches between zyrtec and xyzal - some seasonal component to it, worse in spring. Takes pepcid or zantac as needed for reflux. Cutting back on soft drinks helps some with GERD symptoms. ? ?She has no photophobia, rashes.  ? ?She doesn't snore. No paroxysmal nocturnal dyspnea. Perhaps mild excessive daytime sleepiness.  ? ?Mom has sarcoidosis - she did require treatment in the past, had ocular involvement. ? ?She works as an Dance movement psychotherapist at [employer]. No occupational exposures to dusts/solvents without a mask. Never has lived outside of [place]. She has no pets citing allergy to cats, dogs - she had a dog in college and this was the context in which she discovered she had asthma.   Some cigarettes/MJ in remote past.  ? ?Interval HPI: ?Doing well overall since additions of protonix and symbicort. Now tolerating 2 puffs once daily without exacerbation and achieving satisfactory control. Rare snoring, no PND, no excessive daytime sleepiness. ? ? ?Otherwise pertinent review of systems is negative. ? ? ? ?Past Medical History:  ?Diagnosis Date  ? Asthma   ?  ? ?Family History  ?Problem Relation Age of Onset  ? Hypertension Mother   ? Healthy Father   ?   ? ?No past surgical history on file. ? ?Social History  ? ?Socioeconomic History  ? Marital status: Single  ?  Spouse name: Not on file  ? Number of children: Not on file  ? Years of education: Not on file  ? Highest education level: Not on file  ?Occupational History  ? Not on file  ?Tobacco Use  ? Smoking status: Former  ?  Packs/day: 0.10  ?  Years: 5.00  ?  Pack years: 0.50  ?  Types: Cigarettes  ?  Quit date: 2009  ?  Years since quitting: 14.2  ? Smokeless tobacco: Never  ?Substance and Sexual Activity  ? Alcohol use: Yes  ?  Alcohol/week: 1.0 standard drink  ?  Types: 1 Cans of beer per week  ?  Comment: every other day  ? Drug use: No  ? Sexual activity: Yes  ?  Birth control/protection: Implant  ?Other Topics Concern  ? Not on file  ?Social History Narrative  ? Not on file  ? ?Social Determinants of Health  ? ?Financial Resource Strain: Not on file  ?Food Insecurity: Not on file  ?Transportation Needs: Not on file  ?Physical Activity: Not on file  ?Stress: Not on file  ?Social Connections: Not on file  ?Intimate Partner Violence: Not on file  ?  ? ?Allergies  ?Allergen Reactions  ? Sulfa Antibiotics Rash  ?  ? ?Outpatient Medications Prior to Visit  ?Medication Sig Dispense Refill  ? albuterol (VENTOLIN HFA) 108 (90  Base) MCG/ACT inhaler Inhale 1-2 puffs into the lungs every 6 (six) hours as needed for wheezing or shortness of breath. 1 each 11  ? budesonide-formoterol (SYMBICORT) 80-4.5 MCG/ACT inhaler Inhale 2 puffs into the lungs in the morning and at bedtime. 1 each 12  ? pantoprazole (PROTONIX) 40 MG tablet Take 1 tablet (40 mg total) by mouth daily. 30 minutes before breakfast 90 tablet 1  ? ?No facility-administered medications prior to visit.  ? ? ? ? ? ?Objective:  ? ?Physical Exam: ? ?General appearance: 36 y.o., female, NAD, conversant  ?Eyes: anicteric sclerae; PERRL, tracking appropriately ?HENT: NCAT; MMM ?Neck: Trachea midline; no lymphadenopathy, no JVD ?Lungs: CTAB, no crackles, no wheeze,  with normal respiratory effort ?CV: RRR, no murmur  ?Abdomen: Soft, non-tender; non-distended, BS present  ?Extremities: No peripheral edema, warm ?Skin: Normal turgor and texture; no rash ?Psych: Appropriate affect ?Neuro: Alert and oriented to person and place, no focal deficit  ? ? ? ?Vitals:  ? 10/15/21 1617  ?BP: 136/82  ?Pulse: 86  ?Temp: 98.3 ?F (36.8 ?C)  ?TempSrc: Oral  ?SpO2: 96%  ?Weight: (!) 419 lb 6.4 oz (190.2 kg)  ?Height: 5\' 7"  (1.702 m)  ? ? ? ?96% on RA ?BMI Readings from Last 3 Encounters:  ?10/15/21 65.69 kg/m?  ?07/02/21 65.47 kg/m?  ?03/13/21 64.15 kg/m?  ? ?Wt Readings from Last 3 Encounters:  ?10/15/21 (!) 419 lb 6.4 oz (190.2 kg)  ?07/02/21 (!) 418 lb (189.6 kg)  ?03/13/21 (!) 409 lb 9.6 oz (185.8 kg)  ? ? ? ?CBC ?   ?Component Value Date/Time  ? WBC 7.1 03/13/2021 1530  ? RBC 4.15 03/13/2021 1530  ? HGB 13.0 03/13/2021 1530  ? HCT 39.8 03/13/2021 1530  ? PLT 338 03/13/2021 1530  ? MCV 95.9 03/13/2021 1530  ? MCH 31.3 03/13/2021 1530  ? MCHC 32.7 03/13/2021 1530  ? RDW 12.7 03/13/2021 1530  ? LYMPHSABS 2,251 03/13/2021 1530  ? EOSABS 149 03/13/2021 1530  ? BASOSABS 21 03/13/2021 1530  ? ? ? ?Chest Imaging: ? ?OSH CXR report 09/2014 reviewed by me and unremarkable ? ?HRCT Chest 07/20/21 reviewed by me unremarkable ? ?Pulmonary Functions Testing Results: ? ?  Latest Ref Rng & Units 03/23/2021  ?  3:57 PM  ?PFT Results  ?FVC-Pre L 1.92    ?FVC-Predicted Pre % 54    ?FVC-Post L 2.38    ?FVC-Predicted Post % 67    ?Pre FEV1/FVC % % 61    ?Post FEV1/FCV % % 68    ?FEV1-Pre L 1.18    ?FEV1-Predicted Pre % 40    ?FEV1-Post L 1.61    ?DLCO uncorrected ml/min/mmHg 25.62    ?DLCO UNC% % 104    ?DLCO corrected ml/min/mmHg 25.95    ?DLCO COR %Predicted % 105    ?DLVA Predicted % 155    ?TLC L 5.07    ?TLC % Predicted % 92    ?RV % Predicted % 167    ? ?Reviewed by me remarkable for moderately severe obstruction, air trapping, remarkable BD response, and normal DLCO ?   ?Assessment & Plan:  ? ?# Mild  persistent asthma ? triggers could be rhinitis/PND, GERD.   ? ?Plan: ?- agree with decrease to symbicort 80 2 puffs daily ?- albuterol prn ?- continue ppi 40 mg daily before breakfast for now, discussed GERD TLC measures to facilitate coming off of protonix eventually ? ?RTC prn ? ? ?03/25/2021, MD ?[facility] ?10/15/2021 5:23  PM  ? ?

## 2021-10-15 NOTE — Patient Instructions (Addendum)
-   Symbicort 2 puffs twice daily with spacer, rinse mouth afterward ?- protonix, albuterol refilled ?- CT Chest, make sure there's no evidence of sarcoid ?- They will call you to set up appointment in May - see you then! ?- sleep on right side to decrease reflux ?- food choices as below to decrease reflux and hopefully soon we can get you off of protonix ? ? ? ? ?

## 2021-10-23 ENCOUNTER — Ambulatory Visit (HOSPITAL_COMMUNITY)
Admission: RE | Admit: 2021-10-23 | Discharge: 2021-10-23 | Disposition: A | Discharge status: Home / Self Care | Payer: BC Managed Care – PPO | Source: Ambulatory Visit | Attending: Nurse Practitioner | Admitting: Nurse Practitioner

## 2021-10-23 ENCOUNTER — Encounter (HOSPITAL_COMMUNITY): Payer: Self-pay

## 2021-10-23 VITALS — BP 141/89 | HR 66 | Temp 99.3°F | Resp 17 | Ht 67.0 in

## 2021-10-23 DIAGNOSIS — H66005 Acute suppurative otitis media without spontaneous rupture of ear drum, recurrent, left ear: Secondary | ICD-10-CM

## 2021-10-23 DIAGNOSIS — J4541 Moderate persistent asthma with (acute) exacerbation: Secondary | ICD-10-CM | POA: Diagnosis not present

## 2021-10-23 MED ORDER — CEFDINIR 300 MG PO CAPS: 300.0000 mg | ORAL_CAPSULE | Freq: Two times a day (BID) | ORAL | 0 refills | Status: AC

## 2021-10-23 MED ORDER — PREDNISONE 20 MG PO TABS: 40.0000 mg | ORAL_TABLET | Freq: Every day | ORAL | 0 refills | Status: AC

## 2021-10-23 NOTE — Discharge Instructions (Signed)
-   Please start the prednisone taper tomorrow to help with your asthma exacerbation  ?- Please also start the Cefdinir to help with the ear infection in your left ear ?- Increase fluid intake; you can continue Mucinex to help with congestion and Tylenol/ibuprofen to help with fever/pain ?

## 2021-10-23 NOTE — ED Provider Notes (Signed)
?[facility] ? ? ? ?CSN: [id number] ?Arrival date & time: 10/23/21  1748 ? ? ?  ? ?History   ?Chief Complaint ?Chief Complaint  ?Patient presents with  ? Nasal Congestion  ?  Entered by patient  ? sinus pressure   ? Cough  ? Wheezing  ? ? ?HPI ?Danielle [name] is a 36 y.o. female.  ? ?Patient presents with nasal congestion, sinus pressure, cough, and wheezing for the past few days.  She reports she is not doing well overall, appetite has been slightly decreased.  She denies fevers, body aches, chills, swollen glands, ear pain or pressure or drainage, nausea/vomiting, diarrhea, new rash on her skin.  She is having some shortness of breath with coughing, wheezing, chest tightness, chest and nasal congestion, postnasal drip, scratchy throat, sinus pressure.  She has been taking Mucinex which has not helped much with her symptoms.  She reports she has not been drinking as much fluids as she normally drinks. ? ?Patient has a history of asthma-takes albuterol and Symbicort as prescribed.  She also takes daily oral antihistamine and intranasal corticosteroid. ? ?She was recently treated for an ear infection of her left ear. ? ? ?Past Medical History:  ?Diagnosis Date  ? Asthma   ? ? ?There are no problems to display for this patient. ? ? ?History reviewed. No pertinent surgical history. ? ?OB History   ?No obstetric history on file. ?  ? ? ? ?Home Medications   ? ?Prior to Admission medications   ?Medication Sig Start Date End Date Taking? Authorizing Provider  ?cefdinir (OMNICEF) 300 MG capsule Take 1 capsule (300 mg total) by mouth 2 (two) times daily for 5 days. 10/23/21 10/28/21 Yes Eulogio Bear, NP  ?predniSONE (DELTASONE) 20 MG tablet Take 2 tablets (40 mg total) by mouth daily with breakfast for 5 days. 10/23/21 10/28/21 Yes Eulogio Bear, NP  ?albuterol (VENTOLIN HFA) 108 (90 Base) MCG/ACT inhaler Inhale 1-2 puffs into the lungs every 6 (six) hours as needed for wheezing or shortness of breath.  07/02/21   Maryjane Hurter, MD  ?budesonide-formoterol [facility]) 80-4.5 MCG/ACT inhaler Inhale 2 puffs into the lungs in the morning and at bedtime. 07/02/21   Maryjane Hurter, MD  ?pantoprazole (PROTONIX) 40 MG tablet Take 1 tablet (40 mg total) by mouth daily. 30 minutes before breakfast 07/02/21   Maryjane Hurter, MD  ?famotidine (PEPCID) 10 MG tablet Take 10 mg by mouth 2 (two) times daily.  03/22/20  [provider]  ? ? ?Family History ?Family History  ?Problem Relation Age of Onset  ? Hypertension Mother   ? Healthy Father   ? ? ?Social History ?Social History  ? ?Tobacco Use  ? Smoking status: Former  ?  Packs/day: 0.10  ?  Years: 5.00  ?  Pack years: 0.50  ?  Types: Cigarettes  ?  Quit date: 2009  ?  Years since quitting: 14.3  ? Smokeless tobacco: Never  ?Substance Use Topics  ? Alcohol use: Yes  ?  Alcohol/week: 1.0 standard drink  ?  Types: 1 Cans of beer per week  ?  Comment: every other day  ? Drug use: No  ? ? ? ?Allergies   ?Sulfa antibiotics ? ? ?Review of Systems ?Review of Systems ?Per HPI ? ?Physical Exam ?Triage Vital Signs ?ED Triage Vitals  ?Enc Vitals Group  ?   BP 10/23/21 1810 (!) 141/89  ?   Pulse Rate 10/23/21 1810 66  ?  Resp 10/23/21 1810 17  ?   Temp 10/23/21 1810 99.3 ?F (37.4 ?C)  ?   Temp Source 10/23/21 1810 Oral  ?   SpO2 10/23/21 1810 96 %  ?   Weight --   ?   Height 10/23/21 1809 5\' 7"  (1.702 m)  ?   Head Circumference --   ?   Peak Flow --   ?   Pain Score 10/23/21 1809 0  ?   Pain Loc --   ?   Pain Edu? --   ?   Excl. in [place]? --   ? ?No data found. ? ?Updated Vital Signs ?BP (!) 141/89 (BP Location: Right Wrist)   Pulse 66   Temp 99.3 ?F (37.4 ?C) (Oral)   Resp 17   Ht 5\' 7"  (1.702 m)   SpO2 96%   BMI 65.69 kg/m?  ? ?Visual Acuity ?Right Eye Distance:   ?Left Eye Distance:   ?Bilateral Distance:   ? ?Right Eye Near:   ?Left Eye Near:    ?Bilateral Near:    ? ?Physical Exam ?Vitals and nursing note reviewed.  ?Constitutional:   ?   General: She is not  in acute distress. ?   Appearance: Normal appearance. She is not ill-appearing or toxic-appearing.  ?HENT:  ?   Head: Normocephalic and atraumatic.  ?   Right Ear: Tympanic membrane, ear canal and external ear normal.  ?   Left Ear: Swelling present. A middle ear effusion is present. Tympanic membrane is injected and erythematous.  ?   Nose: Congestion and rhinorrhea present.  ?   Mouth/Throat:  ?   Mouth: Mucous membranes are moist.  ?   Pharynx: Oropharynx is clear. Posterior oropharyngeal erythema present. No oropharyngeal exudate.  ?   Tonsils: No tonsillar exudate. 0 on the right. 0 on the left.  ?   Comments: Cobblestoning of posterior pharynx ?Eyes:  ?   General: No scleral icterus. ?   Extraocular Movements: Extraocular movements intact.  ?Cardiovascular:  ?   Rate and Rhythm: Normal rate and regular rhythm.  ?Pulmonary:  ?   Effort: Pulmonary effort is normal. No respiratory distress.  ?   Breath sounds: Normal breath sounds. No wheezing, rhonchi or rales.  ?   Comments: Audible wheezes heard in exam room, however no wheezes to auscultation ?Abdominal:  ?   General: Abdomen is flat. Bowel sounds are normal. There is no distension.  ?   Palpations: Abdomen is soft.  ?Musculoskeletal:  ?   Cervical back: Normal range of motion and neck supple.  ?Lymphadenopathy:  ?   Cervical: No cervical adenopathy.  ?Skin: ?   General: Skin is warm and dry.  ?   Capillary Refill: Capillary refill takes less than 2 seconds.  ?   Coloration: Skin is not jaundiced or pale.  ?   Findings: No erythema or rash.  ?Neurological:  ?   Mental Status: She is alert and oriented to person, place, and time.  ?Psychiatric:     ?   Mood and Affect: Mood normal.     ?   Behavior: Behavior normal.  ? ? ? ?UC Treatments / Results  ?Labs ?(all labs ordered are listed, but only abnormal results are displayed) ?Labs Reviewed - No data to display ? ?EKG ? ? ?Radiology ?No results found. ? ?Procedures ?Procedures (including critical care  time) ? ?Medications Ordered in UC ?Medications - No data to display ? ?Initial Impression / Assessment and Plan /  UC Course  ?I have reviewed the triage vital signs and the nursing notes. ? ?Pertinent labs & imaging results that were available during my care of the patient were reviewed by me and considered in my medical decision making (see chart for details). ? ?  ?Treat asthma exacerbation with increased use of albuterol, prednisone burst 40 mg daily for 5 days.  Also start cefdinir 300 mg twice daily for 5 days for left ear infection that has not fully cleared up.  Encouraged continued supportive care including increasing fluid intake, Mucinex, Tylenol/ibuprofen.  Seek care if symptoms worsen or persist. ?Final Clinical Impressions(s) / UC Diagnoses  ? ?Final diagnoses:  ?Moderate persistent asthma with acute exacerbation  ?Recurrent acute suppurative otitis media without spontaneous rupture of left tympanic membrane  ? ? ? ?Discharge Instructions   ? ?  ?- Please start the prednisone taper tomorrow to help with your asthma exacerbation  ?- Please also start the Cefdinir to help with the ear infection in your left ear ?- Increase fluid intake; you can continue Mucinex to help with congestion and Tylenol/ibuprofen to help with fever/pain ? ? ? ?ED Prescriptions   ? ? Medication Sig Dispense Auth. Provider  ? cefdinir (OMNICEF) 300 MG capsule Take 1 capsule (300 mg total) by mouth 2 (two) times daily for 5 days. 10 capsule Noemi Chapel A, NP  ? predniSONE (DELTASONE) 20 MG tablet Take 2 tablets (40 mg total) by mouth daily with breakfast for 5 days. 10 tablet Eulogio Bear, NP  ? ?  ? ?PDMP not reviewed this encounter. ?  ?Eulogio Bear, NP ?10/23/21 1852 ? ?

## 2021-10-23 NOTE — ED Triage Notes (Signed)
Pt reports sinus pressure, cough, wheezing and nasal congestion and post nasal drip x 3 days.  ?

## 2021-11-20 ENCOUNTER — Ambulatory Visit: Payer: BC Managed Care – PPO | Admitting: Family Medicine

## 2022-02-17 ENCOUNTER — Encounter: Payer: Self-pay | Admitting: Family Medicine

## 2022-02-17 ENCOUNTER — Ambulatory Visit (INDEPENDENT_AMBULATORY_CARE_PROVIDER_SITE_OTHER): Payer: BC Managed Care – PPO | Admitting: Family Medicine

## 2022-02-17 VITALS — BP 121/81 | HR 91 | Temp 97.6°F | Resp 18 | Ht 67.0 in | Wt >= 6400 oz

## 2022-02-17 DIAGNOSIS — Z7689 Persons encountering health services in other specified circumstances: Secondary | ICD-10-CM | POA: Diagnosis not present

## 2022-02-17 DIAGNOSIS — J4540 Moderate persistent asthma, uncomplicated: Secondary | ICD-10-CM | POA: Diagnosis not present

## 2022-02-17 DIAGNOSIS — K219 Gastro-esophageal reflux disease without esophagitis: Secondary | ICD-10-CM

## 2022-02-17 DIAGNOSIS — Z6841 Body Mass Index (BMI) 40.0 and over, adult: Secondary | ICD-10-CM

## 2022-02-17 MED ORDER — PHENTERMINE HCL 37.5 MG PO CAPS: 37.5000 mg | ORAL_CAPSULE | ORAL | 0 refills | Status: DC

## 2022-02-17 NOTE — Progress Notes (Unsigned)
New Patient Office Visit  Subjective    Patient ID: Danielle [name], female    DOB: 04/16/1986  Age: 36 y.o. MRN: [id number]  CC:  Chief Complaint  Patient presents with   Establish Care    HPI [name] presents to establish care and for review of chronic med issues. Patient denies acute complaints or concerns.    Outpatient Encounter Medications as of 02/17/2022  Medication Sig   fluticasone (FLONASE) 50 MCG/ACT nasal spray 2 sprays into each nostril daily.   [DISCONTINUED] fluticasone-salmeterol (ADVAIR) 250-50 MCG/ACT AEPB Inhale into the lungs.   albuterol (VENTOLIN HFA) 108 (90 Base) MCG/ACT inhaler Inhale 1-2 puffs into the lungs every 6 (six) hours as needed for wheezing or shortness of breath.   budesonide-formoterol (SYMBICORT) 80-4.5 MCG/ACT inhaler Inhale 2 puffs into the lungs in the morning and at bedtime.   pantoprazole (PROTONIX) 40 MG tablet Take 1 tablet (40 mg total) by mouth daily. 30 minutes before breakfast   [DISCONTINUED] famotidine (PEPCID) 10 MG tablet Take 10 mg by mouth 2 (two) times daily.   [DISCONTINUED] fluticasone (FLONASE) 50 MCG/ACT nasal spray Place into both nostrils.   No facility-administered encounter medications on file as of 02/17/2022.    Past Medical History:  Diagnosis Date   Allergy    Asthma     No past surgical history on file.  Family History  Problem Relation Age of Onset   Hypertension Mother    Healthy Father     Social History   Socioeconomic History   Marital status: Single    Spouse name: Not on file   Number of children: Not on file   Years of education: Not on file   Highest education level: Not on file  Occupational History   Not on file  Tobacco Use   Smoking status: Former    Packs/day: 0.10    Years: 5.00    Total pack years: 0.50    Types: Cigarettes    Quit date: 07/06/2007    Years since quitting: 14.6   Smokeless tobacco: Never  Substance and Sexual Activity   Alcohol use: Yes     Comment: Occasionally   Drug use: No   Sexual activity: Yes    Birth control/protection: I.U.D.  Other Topics Concern   Not on file  Social History Narrative   Not on file   Social Determinants of Health   Financial Resource Strain: Not on file  Food Insecurity: Not on file  Transportation Needs: Not on file  Physical Activity: Not on file  Stress: Not on file  Social Connections: Not on file  Intimate Partner Violence: Not on file    Review of Systems  All other systems reviewed and are negative.       Objective    BP 121/81   Pulse 91   Temp 97.6 F (36.4 C) (Oral)   Resp 18   Ht 5\' 7"  (1.702 m)   Wt (!) 430 lb 12.8 oz (195.4 kg)   SpO2 95%   BMI 67.47 kg/m   Physical Exam Vitals and nursing note reviewed.  Constitutional:      General: She is not in acute distress.    Appearance: She is obese.  Cardiovascular:     Rate and Rhythm: Normal rate and regular rhythm.  Pulmonary:     Effort: Pulmonary effort is normal.     Breath sounds: Normal breath sounds.  Abdominal:     Palpations: Abdomen is soft.  Tenderness: There is no abdominal tenderness.  Neurological:     General: No focal deficit present.     Mental Status: She is alert and oriented to person, place, and time.     {Labs (Optional):23779}    Assessment & Plan:   Problem List Items Addressed This Visit   None   No follow-ups on file.   Tommie Raymond, MD

## 2022-02-17 NOTE — Progress Notes (Unsigned)
Patient is here for new pt visit. Patient has no new concerns. Just need a new PCP

## 2022-02-18 ENCOUNTER — Encounter: Payer: Self-pay | Admitting: Family Medicine

## 2022-03-18 ENCOUNTER — Encounter: Payer: Self-pay | Admitting: Family Medicine

## 2022-03-18 ENCOUNTER — Ambulatory Visit (INDEPENDENT_AMBULATORY_CARE_PROVIDER_SITE_OTHER): Payer: BC Managed Care – PPO | Admitting: Family Medicine

## 2022-03-18 VITALS — BP 115/81 | HR 86 | Temp 97.7°F | Resp 18 | Wt >= 6400 oz

## 2022-03-18 DIAGNOSIS — Z713 Dietary counseling and surveillance: Secondary | ICD-10-CM | POA: Diagnosis not present

## 2022-03-18 DIAGNOSIS — Z6841 Body Mass Index (BMI) 40.0 and over, adult: Secondary | ICD-10-CM

## 2022-03-18 DIAGNOSIS — Z7689 Persons encountering health services in other specified circumstances: Secondary | ICD-10-CM | POA: Diagnosis not present

## 2022-03-18 MED ORDER — PHENTERMINE HCL 37.5 MG PO CAPS: 37.5000 mg | ORAL_CAPSULE | ORAL | 0 refills | Status: DC

## 2022-03-19 ENCOUNTER — Other Ambulatory Visit: Payer: Self-pay | Admitting: Student

## 2022-03-20 ENCOUNTER — Encounter: Payer: Self-pay | Admitting: Family Medicine

## 2022-03-20 NOTE — Progress Notes (Signed)
Established Patient Office Visit  Subjective    Patient ID: Danielle [name], female    DOB: 1985-10-26  Age: 36 y.o. MRN: [id number]  CC:  Chief Complaint  Patient presents with   Weight Check    HPI SEMIAH SISTARE presents for weight management. Patient denies acute complaints or concerns.    Outpatient Encounter Medications as of 03/18/2022  Medication Sig   albuterol (VENTOLIN HFA) 108 (90 Base) MCG/ACT inhaler Inhale 1-2 puffs into the lungs every 6 (six) hours as needed for wheezing or shortness of breath.   budesonide-formoterol (SYMBICORT) 80-4.5 MCG/ACT inhaler Inhale 2 puffs into the lungs in the morning and at bedtime.   fluticasone (FLONASE) 50 MCG/ACT nasal spray 2 sprays into each nostril daily.   pantoprazole (PROTONIX) 40 MG tablet Take 1 tablet (40 mg total) by mouth daily. 30 minutes before breakfast   phentermine 37.5 MG capsule Take 1 capsule (37.5 mg total) by mouth every morning.   [DISCONTINUED] famotidine (PEPCID) 10 MG tablet Take 10 mg by mouth 2 (two) times daily.   [DISCONTINUED] phentermine 37.5 MG capsule Take 1 capsule (37.5 mg total) by mouth every morning.   No facility-administered encounter medications on file as of 03/18/2022.    Past Medical History:  Diagnosis Date   Allergy    Asthma     History reviewed. No pertinent surgical history.  Family History  Problem Relation Age of Onset   Hypertension Mother    Healthy Father     Social History   Socioeconomic History   Marital status: Single    Spouse name: Not on file   Number of children: Not on file   Years of education: Not on file   Highest education level: Not on file  Occupational History   Not on file  Tobacco Use   Smoking status: Former    Packs/day: 0.10    Years: 5.00    Total pack years: 0.50    Types: Cigarettes    Quit date: 07/06/2007    Years since quitting: 14.7   Smokeless tobacco: Never  Substance and Sexual Activity   Alcohol use: Yes    Comment:  Occasionally   Drug use: No   Sexual activity: Yes    Birth control/protection: I.U.D.  Other Topics Concern   Not on file  Social History Narrative   Not on file   Social Determinants of Health   Financial Resource Strain: Not on file  Food Insecurity: Not on file  Transportation Needs: Not on file  Physical Activity: Not on file  Stress: Not on file  Social Connections: Not on file  Intimate Partner Violence: Not on file    Review of Systems  All other systems reviewed and are negative.       Objective    BP 115/81   Pulse 86   Temp 97.7 F (36.5 C) (Oral)   Resp 18   Wt (!) 420 lb 12.8 oz (190.9 kg)   SpO2 98%   BMI 65.91 kg/m   Physical Exam Vitals and nursing note reviewed.  Constitutional:      General: She is not in acute distress.    Appearance: She is obese.  Cardiovascular:     Rate and Rhythm: Normal rate and regular rhythm.  Pulmonary:     Effort: Pulmonary effort is normal.     Breath sounds: Normal breath sounds.  Neurological:     General: No focal deficit present.     Mental Status:  She is alert and oriented to person, place, and time.         Assessment & Plan:   1. Encounter for weight management Doing well with present management. Continue. Meds refilled.   2. Class 3 severe obesity due to excess calories without serious comorbidity with body mass index (BMI) of 60.0 to 69.9 in adult [facility])     Return in about 4 weeks (around 04/15/2022) for follow up.   Becky Sax, MD

## 2022-04-06 ENCOUNTER — Telehealth: Payer: Self-pay | Admitting: Family Medicine

## 2022-04-06 ENCOUNTER — Other Ambulatory Visit: Payer: Self-pay | Admitting: Family Medicine

## 2022-04-06 MED ORDER — PHENTERMINE HCL 37.5 MG PO TABS
37.5000 mg | ORAL_TABLET | Freq: Every day | ORAL | 0 refills | Status: DC
Start: 2022-04-06 — End: 2022-05-31

## 2022-04-06 NOTE — Telephone Encounter (Signed)
Will forward to provider  

## 2022-04-06 NOTE — Telephone Encounter (Signed)
Pt called saying [facility] told her they do not have the phentermine capsules but have the tablets.  Can she use the tablets if so can a new Rx be sent over to [facility].  CB@  [phone]

## 2022-04-27 ENCOUNTER — Ambulatory Visit: Payer: BC Managed Care – PPO | Admitting: Family Medicine

## 2022-04-27 ENCOUNTER — Encounter: Payer: Self-pay | Admitting: Family Medicine

## 2022-04-27 ENCOUNTER — Ambulatory Visit (INDEPENDENT_AMBULATORY_CARE_PROVIDER_SITE_OTHER): Payer: BC Managed Care – PPO | Admitting: Family Medicine

## 2022-04-27 VITALS — BP 123/87 | HR 89 | Temp 98.1°F | Resp 16 | Wt >= 6400 oz

## 2022-04-27 DIAGNOSIS — Z7689 Persons encountering health services in other specified circumstances: Secondary | ICD-10-CM

## 2022-04-27 DIAGNOSIS — Z6841 Body Mass Index (BMI) 40.0 and over, adult: Secondary | ICD-10-CM

## 2022-04-27 MED ORDER — PHENTERMINE HCL 37.5 MG PO CAPS: 37.5000 mg | ORAL_CAPSULE | ORAL | 0 refills | Status: DC

## 2022-04-28 ENCOUNTER — Encounter: Payer: Self-pay | Admitting: Family Medicine

## 2022-04-28 NOTE — Progress Notes (Signed)
Established Patient Office Visit  Subjective    Patient ID: Danielle [name], female    DOB: [birth year]  Age: 36 y.o. MRN: [id number]  CC:  Chief Complaint  Patient presents with   Weight Check    HPI Danielle [name] presents for routine weight management appt. Patient denies acute complaints or concerns.    Outpatient Encounter Medications as of 04/27/2022  Medication Sig   albuterol (VENTOLIN HFA) 108 (90 Base) MCG/ACT inhaler Inhale 1-2 puffs into the lungs every 6 (six) hours as needed for wheezing or shortness of breath.   budesonide-formoterol (SYMBICORT) 80-4.5 MCG/ACT inhaler Inhale 2 puffs into the lungs in the morning and at bedtime.   fluticasone (FLONASE) 50 MCG/ACT nasal spray 2 sprays into each nostril daily.   pantoprazole (PROTONIX) 40 MG tablet TAKE 1 TABLET (40 MG TOTAL) BY MOUTH DAILY 30 MINUTES BEFORE BREAKFAST   phentermine (ADIPEX-P) 37.5 MG tablet Take 1 tablet (37.5 mg total) by mouth daily before breakfast.   [DISCONTINUED] phentermine 37.5 MG capsule Take 1 capsule (37.5 mg total) by mouth every morning.   phentermine 37.5 MG capsule Take 1 capsule (37.5 mg total) by mouth every morning.   [DISCONTINUED] famotidine (PEPCID) 10 MG tablet Take 10 mg by mouth 2 (two) times daily.   No facility-administered encounter medications on file as of 04/27/2022.    Past Medical History:  Diagnosis Date   Allergy    Asthma     History reviewed. No pertinent surgical history.  Family History  Problem Relation Age of Onset   Hypertension Mother    Healthy Father     Social History   Socioeconomic History   Marital status: Single    Spouse name: Not on file   Number of children: Not on file   Years of education: Not on file   Highest education level: Not on file  Occupational History   Not on file  Tobacco Use   Smoking status: Former    Packs/day: 0.10    Years: 5.00    Total pack years: 0.50    Types: Cigarettes    Quit date: 07/06/2007    Years  since quitting: 14.8   Smokeless tobacco: Never  Substance and Sexual Activity   Alcohol use: Yes    Comment: Occasionally   Drug use: No   Sexual activity: Yes    Birth control/protection: I.U.D.  Other Topics Concern   Not on file  Social History Narrative   Not on file   Social Determinants of Health   Financial Resource Strain: Not on file  Food Insecurity: Not on file  Transportation Needs: Not on file  Physical Activity: Not on file  Stress: Not on file  Social Connections: Not on file  Intimate Partner Violence: Not on file    Review of Systems  All other systems reviewed and are negative.       Objective    BP 123/87   Pulse 89   Temp 98.1 F (36.7 C) (Oral)   Resp 16   Wt (!) 416 lb 9.6 oz (189 kg)   SpO2 98%   BMI 65.25 kg/m   Physical Exam Vitals and nursing note reviewed.  Constitutional:      General: She is not in acute distress.    Appearance: She is obese.  Cardiovascular:     Rate and Rhythm: Normal rate and regular rhythm.  Pulmonary:     Effort: Pulmonary effort is normal.     Breath sounds:  Normal breath sounds.  Neurological:     General: No focal deficit present.     Mental Status: She is alert and oriented to person, place, and time.         Assessment & Plan:   1. Encounter for weight management Progressing well. Goal lis 4-6lbs/mo wt loss. Phentermine refilled.   2. Class 3 severe obesity due to excess calories without serious comorbidity with body mass index (BMI) of 60.0 to 69.9 in adult [facility])     Return in about 4 weeks (around 05/25/2022) for follow up.   Becky Sax, MD

## 2022-05-14 DIAGNOSIS — Z23 Encounter for immunization: Secondary | ICD-10-CM | POA: Diagnosis not present

## 2022-05-31 ENCOUNTER — Encounter: Payer: Self-pay | Admitting: Family Medicine

## 2022-05-31 ENCOUNTER — Ambulatory Visit (INDEPENDENT_AMBULATORY_CARE_PROVIDER_SITE_OTHER): Payer: BC Managed Care – PPO | Admitting: Family Medicine

## 2022-05-31 VITALS — BP 114/82 | HR 93 | Temp 98.1°F | Resp 16 | Wt >= 6400 oz

## 2022-05-31 DIAGNOSIS — Z7689 Persons encountering health services in other specified circumstances: Secondary | ICD-10-CM | POA: Diagnosis not present

## 2022-05-31 DIAGNOSIS — Z6841 Body Mass Index (BMI) 40.0 and over, adult: Secondary | ICD-10-CM

## 2022-05-31 DIAGNOSIS — E66813 Obesity, class 3: Secondary | ICD-10-CM

## 2022-05-31 DIAGNOSIS — J4541 Moderate persistent asthma with (acute) exacerbation: Secondary | ICD-10-CM | POA: Diagnosis not present

## 2022-05-31 MED ORDER — PHENTERMINE HCL 37.5 MG PO CAPS: 37.5000 mg | ORAL_CAPSULE | ORAL | 0 refills | Status: DC

## 2022-05-31 MED ORDER — AZITHROMYCIN 250 MG PO TABS: ORAL_TABLET | ORAL | 0 refills | Status: AC

## 2022-05-31 MED ORDER — PREDNISONE 50 MG PO TABS: 50.0000 mg | ORAL_TABLET | Freq: Every day | ORAL | 0 refills | Status: DC

## 2022-05-31 NOTE — Progress Notes (Unsigned)
Patient is here for weight check.

## 2022-06-01 ENCOUNTER — Encounter: Payer: Self-pay | Admitting: Family Medicine

## 2022-06-01 NOTE — Progress Notes (Signed)
Established Patient Office Visit  Subjective    Patient ID: ALYZAE HAWKEY, female    DOB: 1986-04-20  Age: 36 y.o. MRN: [id number]  CC:  Chief Complaint  Patient presents with   Weight Check    HPI NONNIE PICKNEY presents for routine weight management. Patient also reports that she has been having increased asthma sx with coughing, wheezing, and purulent sputum.    Outpatient Encounter Medications as of 05/31/2022  Medication Sig   albuterol (VENTOLIN HFA) 108 (90 Base) MCG/ACT inhaler Inhale 1-2 puffs into the lungs every 6 (six) hours as needed for wheezing or shortness of breath.   azithromycin (ZITHROMAX) 250 MG tablet Take 2 tablets on day 1, then 1 tablet daily on days 2 through 5   budesonide-formoterol (SYMBICORT) 80-4.5 MCG/ACT inhaler Inhale 2 puffs into the lungs in the morning and at bedtime.   fluticasone (FLONASE) 50 MCG/ACT nasal spray 2 sprays into each nostril daily.   pantoprazole (PROTONIX) 40 MG tablet TAKE 1 TABLET (40 MG TOTAL) BY MOUTH DAILY 30 MINUTES BEFORE BREAKFAST   predniSONE (DELTASONE) 50 MG tablet Take 1 tablet (50 mg total) by mouth daily with breakfast.   [DISCONTINUED] phentermine (ADIPEX-P) 37.5 MG tablet Take 1 tablet (37.5 mg total) by mouth daily before breakfast.   [DISCONTINUED] phentermine 37.5 MG capsule Take 1 capsule (37.5 mg total) by mouth every morning.   phentermine 37.5 MG capsule Take 1 capsule (37.5 mg total) by mouth every morning.   [DISCONTINUED] famotidine (PEPCID) 10 MG tablet Take 10 mg by mouth 2 (two) times daily.   No facility-administered encounter medications on file as of 05/31/2022.    Past Medical History:  Diagnosis Date   Allergy    Asthma     No past surgical history on file.  Family History  Problem Relation Age of Onset   Hypertension Mother    Healthy Father     Social History   Socioeconomic History   Marital status: Single    Spouse name: Not on file   Number of children: Not on file    Years of education: Not on file   Highest education level: Not on file  Occupational History   Not on file  Tobacco Use   Smoking status: Former    Packs/day: 0.10    Years: 5.00    Total pack years: 0.50    Types: Cigarettes    Quit date: 07/06/2007    Years since quitting: 14.9   Smokeless tobacco: Never  Substance and Sexual Activity   Alcohol use: Yes    Comment: Occasionally   Drug use: No   Sexual activity: Yes    Birth control/protection: I.U.D.  Other Topics Concern   Not on file  Social History Narrative   Not on file   Social Determinants of Health   Financial Resource Strain: Not on file  Food Insecurity: Not on file  Transportation Needs: Not on file  Physical Activity: Not on file  Stress: Not on file  Social Connections: Not on file  Intimate Partner Violence: Not on file    Review of Systems  Respiratory:  Positive for cough and wheezing. Negative for shortness of breath.   All other systems reviewed and are negative.       Objective    BP 114/82   Pulse 93   Temp 98.1 F (36.7 C) (Oral)   Resp 16   Wt (!) 412 lb 12.8 oz (187.2 kg)   SpO2 95%  BMI 64.65 kg/m   Physical Exam Vitals and nursing note reviewed.  Constitutional:      General: She is not in acute distress.    Appearance: She is obese.  Cardiovascular:     Rate and Rhythm: Normal rate and regular rhythm.  Pulmonary:     Effort: Pulmonary effort is normal.     Breath sounds: Wheezing present.  Neurological:     General: No focal deficit present.     Mental Status: She is alert and oriented to person, place, and time.         Assessment & Plan:   1. Encounter for weight management Patient continues to lose weight. Continue. Phentermine refilled.   2. Class 3 severe obesity due to excess calories without serious comorbidity with body mass index (BMI) of 60.0 to 69.9 in adult (HCC)   3. Moderate persistent asthma with acute exacerbation Prednisone and zithromax  prescribed    Return in about 4 weeks (around 06/28/2022) for follow up.   Tommie Raymond, MD

## 2022-06-24 ENCOUNTER — Ambulatory Visit (INDEPENDENT_AMBULATORY_CARE_PROVIDER_SITE_OTHER): Payer: BC Managed Care – PPO | Admitting: Family Medicine

## 2022-06-24 ENCOUNTER — Encounter: Payer: Self-pay | Admitting: Family Medicine

## 2022-06-24 VITALS — BP 129/87 | HR 94 | Temp 98.1°F | Resp 16 | Wt >= 6400 oz

## 2022-06-24 DIAGNOSIS — Z7689 Persons encountering health services in other specified circumstances: Secondary | ICD-10-CM | POA: Diagnosis not present

## 2022-06-24 DIAGNOSIS — Z6841 Body Mass Index (BMI) 40.0 and over, adult: Secondary | ICD-10-CM | POA: Diagnosis not present

## 2022-06-24 MED ORDER — PHENTERMINE HCL 37.5 MG PO CAPS: 37.5000 mg | ORAL_CAPSULE | ORAL | 0 refills | Status: DC

## 2022-06-24 NOTE — Progress Notes (Signed)
Established Patient Office Visit  Subjective    Patient ID: Danielle [name], female    DOB: 03/08/1986  Age: 36 y.o. MRN: [id number]  CC: No chief complaint on file.   HPI Danielle [name] presents for routine weight management. Patient denies acute complaints or concerns.    Outpatient Encounter Medications as of 06/24/2022  Medication Sig   albuterol (VENTOLIN HFA) 108 (90 Base) MCG/ACT inhaler Inhale 1-2 puffs into the lungs every 6 (six) hours as needed for wheezing or shortness of breath.   budesonide-formoterol (SYMBICORT) 80-4.5 MCG/ACT inhaler Inhale 2 puffs into the lungs in the morning and at bedtime.   fluticasone (FLONASE) 50 MCG/ACT nasal spray 2 sprays into each nostril daily.   pantoprazole (PROTONIX) 40 MG tablet TAKE 1 TABLET (40 MG TOTAL) BY MOUTH DAILY 30 MINUTES BEFORE BREAKFAST   [DISCONTINUED] phentermine 37.5 MG capsule Take 1 capsule (37.5 mg total) by mouth every morning.   phentermine 37.5 MG capsule Take 1 capsule (37.5 mg total) by mouth every morning.   predniSONE (DELTASONE) 50 MG tablet Take 1 tablet (50 mg total) by mouth daily with breakfast.   [DISCONTINUED] famotidine (PEPCID) 10 MG tablet Take 10 mg by mouth 2 (two) times daily.   No facility-administered encounter medications on file as of 06/24/2022.    Past Medical History:  Diagnosis Date   Allergy    Asthma     History reviewed. No pertinent surgical history.  Family History  Problem Relation Age of Onset   Hypertension Mother    Healthy Father     Social History   Socioeconomic History   Marital status: Single    Spouse name: Not on file   Number of children: Not on file   Years of education: Not on file   Highest education level: Not on file  Occupational History   Not on file  Tobacco Use   Smoking status: Former    Packs/day: 0.10    Years: 5.00    Total pack years: 0.50    Types: Cigarettes    Quit date: 07/06/2007    Years since quitting: 14.9   Smokeless  tobacco: Never  Substance and Sexual Activity   Alcohol use: Yes    Comment: Occasionally   Drug use: No   Sexual activity: Yes    Birth control/protection: I.U.D.  Other Topics Concern   Not on file  Social History Narrative   Not on file   Social Determinants of Health   Financial Resource Strain: Not on file  Food Insecurity: Not on file  Transportation Needs: Not on file  Physical Activity: Not on file  Stress: Not on file  Social Connections: Not on file  Intimate Partner Violence: Not on file    Review of Systems  All other systems reviewed and are negative.       Objective    BP 129/87   Pulse 94   Temp 98.1 F (36.7 C) (Oral)   Resp 16   Wt (!) 417 lb (189.1 kg)   SpO2 98%   BMI 65.31 kg/m   Physical Exam Vitals and nursing note reviewed.  Constitutional:      General: She is not in acute distress.    Appearance: She is obese.  Cardiovascular:     Rate and Rhythm: Normal rate and regular rhythm.  Pulmonary:     Effort: Pulmonary effort is normal.     Breath sounds: Normal breath sounds.  Neurological:     General:  No focal deficit present.     Mental Status: She is alert and oriented to person, place, and time.         Assessment & Plan:   1. Encounter for weight management Weight gain this month with management. Patient would like to see dietician as and adjunct. Phentermine refilled. Compliance discussed.  - Amb ref to Medical Nutrition Therapy-MNT  2. Morbid obesity (HCC) As above     No follow-ups on file.   Tommie Raymond, MD

## 2022-07-05 ENCOUNTER — Other Ambulatory Visit: Payer: Self-pay | Admitting: Student

## 2022-07-06 ENCOUNTER — Other Ambulatory Visit: Payer: Self-pay | Admitting: Student

## 2022-07-08 ENCOUNTER — Encounter: Payer: Self-pay | Admitting: Family Medicine

## 2022-07-19 ENCOUNTER — Ambulatory Visit (INDEPENDENT_AMBULATORY_CARE_PROVIDER_SITE_OTHER): Payer: BC Managed Care – PPO | Admitting: Family Medicine

## 2022-07-19 ENCOUNTER — Encounter: Payer: Self-pay | Admitting: Family Medicine

## 2022-07-19 VITALS — BP 122/81 | HR 92 | Temp 98.1°F | Resp 16 | Wt >= 6400 oz

## 2022-07-19 DIAGNOSIS — Z6841 Body Mass Index (BMI) 40.0 and over, adult: Secondary | ICD-10-CM

## 2022-07-19 DIAGNOSIS — Z7689 Persons encountering health services in other specified circumstances: Secondary | ICD-10-CM

## 2022-07-21 ENCOUNTER — Encounter: Payer: Self-pay | Admitting: Family Medicine

## 2022-07-21 NOTE — Progress Notes (Signed)
Established Patient Office Visit  Subjective    Patient ID: Danielle [name], female    DOB: May 12, 1986  Age: 37 y.o. MRN: [id number]  CC:  Chief Complaint  Patient presents with   Weight Check    HPI Danielle [name] presents for routine weight management. Patient reports that she did not do well on this month and would like to take a break from any medication.    Outpatient Encounter Medications as of 07/19/2022  Medication Sig   albuterol (VENTOLIN HFA) 108 (90 Base) MCG/ACT inhaler INHALE 1-2 PUFFS BY MOUTH EVERY 6 HOURS AS NEEDED FOR WHEEZE OR SHORTNESS OF BREATH   budesonide-formoterol (SYMBICORT) 80-4.5 MCG/ACT inhaler INHALE 2 PUFFS INTO THE LUNGS IN THE MORNING AND AT BEDTIME.   pantoprazole (PROTONIX) 40 MG tablet TAKE 1 TABLET (40 MG TOTAL) BY MOUTH DAILY 30 MINUTES BEFORE BREAKFAST   phentermine 37.5 MG capsule Take 1 capsule (37.5 mg total) by mouth every morning.   fluticasone (FLONASE) 50 MCG/ACT nasal spray 2 sprays into each nostril daily.   [DISCONTINUED] famotidine (PEPCID) 10 MG tablet Take 10 mg by mouth 2 (two) times daily.   No facility-administered encounter medications on file as of 07/19/2022.    Past Medical History:  Diagnosis Date   Allergy    Asthma     No past surgical history on file.  Family History  Problem Relation Age of Onset   Hypertension Mother    Healthy Father     Social History   Socioeconomic History   Marital status: Single    Spouse name: Not on file   Number of children: Not on file   Years of education: Not on file   Highest education level: Not on file  Occupational History   Not on file  Tobacco Use   Smoking status: Former    Packs/day: 0.10    Years: 5.00    Total pack years: 0.50    Types: Cigarettes    Quit date: 07/06/2007    Years since quitting: 15.0   Smokeless tobacco: Never  Substance and Sexual Activity   Alcohol use: Yes    Comment: Occasionally   Drug use: No   Sexual activity: Yes    Birth  control/protection: I.U.D.  Other Topics Concern   Not on file  Social History Narrative   Not on file   Social Determinants of Health   Financial Resource Strain: Not on file  Food Insecurity: Not on file  Transportation Needs: Not on file  Physical Activity: Not on file  Stress: Not on file  Social Connections: Not on file  Intimate Partner Violence: Not on file    Review of Systems  All other systems reviewed and are negative.       Objective    BP 122/81   Pulse 92   Temp 98.1 F (36.7 C) (Oral)   Resp 16   Wt (!) 419 lb (190.1 kg)   SpO2 96%   BMI 65.62 kg/m   Physical Exam Vitals and nursing note reviewed.  Constitutional:      General: She is not in acute distress.    Appearance: She is obese.  Cardiovascular:     Rate and Rhythm: Normal rate and regular rhythm.  Pulmonary:     Effort: Pulmonary effort is normal.     Breath sounds: Normal breath sounds.  Neurological:     General: No focal deficit present.     Mental Status: She is alert and oriented  to person, place, and time.         Assessment & Plan:   1. Encounter for weight management Weight gain on this visit. Patient defers med refill at this time. Discussed dietary and activity options.   2. Class 3 severe obesity due to excess calories with serious comorbidity and body mass index (BMI) of 60.0 to 69.9 in adult [facility])     Return in about 2 months (around 09/17/2022) for follow up.   Becky Sax, MD

## 2022-08-09 ENCOUNTER — Other Ambulatory Visit: Payer: Self-pay | Admitting: Family Medicine

## 2022-08-09 NOTE — Telephone Encounter (Signed)
phentermine 37.5 MG capsule  Pt has sent [place] and calling office, states last visit was to hold off on this script but she is really struggling and feels she needs to return back on med.  [id number] - [place], [place]. Phone: [phone]  Fax: [phone]     Questions [phone]

## 2022-08-16 ENCOUNTER — Encounter: Payer: Self-pay | Admitting: Family Medicine

## 2022-08-16 ENCOUNTER — Ambulatory Visit (INDEPENDENT_AMBULATORY_CARE_PROVIDER_SITE_OTHER): Payer: BC Managed Care – PPO | Admitting: Family Medicine

## 2022-08-16 VITALS — BP 119/86 | HR 83 | Temp 98.1°F | Resp 16 | Wt >= 6400 oz

## 2022-08-16 DIAGNOSIS — Z7689 Persons encountering health services in other specified circumstances: Secondary | ICD-10-CM | POA: Diagnosis not present

## 2022-08-16 DIAGNOSIS — Z6841 Body Mass Index (BMI) 40.0 and over, adult: Secondary | ICD-10-CM

## 2022-08-16 MED ORDER — PHENTERMINE HCL 37.5 MG PO CAPS: 37.5000 mg | ORAL_CAPSULE | ORAL | 0 refills | Status: DC

## 2022-08-16 NOTE — Progress Notes (Signed)
Established Patient Office Visit  Subjective    Patient ID: Danielle [name], female    DOB: August 02, 1985  Age: 37 y.o. MRN: [id number]  CC:  Chief Complaint  Patient presents with   Weight Check    HPI Danielle [name] presents for routine weight management. She reports that although she has only been off of the med for 1 month, she is gaining a considerable amount of weight    Outpatient Encounter Medications as of 08/16/2022  Medication Sig   albuterol (VENTOLIN HFA) 108 (90 Base) MCG/ACT inhaler INHALE 1-2 PUFFS BY MOUTH EVERY 6 HOURS AS NEEDED FOR WHEEZE OR SHORTNESS OF BREATH   budesonide-formoterol (SYMBICORT) 80-4.5 MCG/ACT inhaler INHALE 2 PUFFS INTO THE LUNGS IN THE MORNING AND AT BEDTIME.   fluticasone (FLONASE) 50 MCG/ACT nasal spray 2 sprays into each nostril daily.   pantoprazole (PROTONIX) 40 MG tablet TAKE 1 TABLET (40 MG TOTAL) BY MOUTH DAILY 30 MINUTES BEFORE BREAKFAST   phentermine 37.5 MG capsule Take 1 capsule (37.5 mg total) by mouth every morning.   [DISCONTINUED] famotidine (PEPCID) 10 MG tablet Take 10 mg by mouth 2 (two) times daily.   [DISCONTINUED] phentermine 37.5 MG capsule Take 1 capsule (37.5 mg total) by mouth every morning.   No facility-administered encounter medications on file as of 08/16/2022.    Past Medical History:  Diagnosis Date   Allergy    Asthma     History reviewed. No pertinent surgical history.  Family History  Problem Relation Age of Onset   Hypertension Mother    Healthy Father     Social History   Socioeconomic History   Marital status: Single    Spouse name: Not on file   Number of children: Not on file   Years of education: Not on file   Highest education level: Not on file  Occupational History   Not on file  Tobacco Use   Smoking status: Former    Packs/day: 0.10    Years: 5.00    Total pack years: 0.50    Types: Cigarettes    Quit date: 07/06/2007    Years since quitting: 15.1   Smokeless tobacco: Never   Substance and Sexual Activity   Alcohol use: Yes    Comment: Occasionally   Drug use: No   Sexual activity: Yes    Birth control/protection: I.U.D.  Other Topics Concern   Not on file  Social History Narrative   Not on file   Social Determinants of Health   Financial Resource Strain: Not on file  Food Insecurity: Not on file  Transportation Needs: Not on file  Physical Activity: Not on file  Stress: Not on file  Social Connections: Not on file  Intimate Partner Violence: Not on file    Review of Systems  All other systems reviewed and are negative.       Objective    BP 119/86   Pulse 83   Temp 98.1 F (36.7 C) (Oral)   Resp 16   Wt (!) 425 lb (192.8 kg)   SpO2 97%   BMI 66.56 kg/m   Physical Exam Vitals and nursing note reviewed.  Constitutional:      General: She is not in acute distress.    Appearance: She is obese.  Cardiovascular:     Rate and Rhythm: Normal rate and regular rhythm.  Pulmonary:     Effort: Pulmonary effort is normal.     Breath sounds: Normal breath sounds.  Neurological:  General: No focal deficit present.     Mental Status: She is alert and oriented to person, place, and time.         Assessment & Plan:   1. Encounter for weight management Patient not doing well off of meds. Phentermine refilled. Goal is 5-6lbs/mo wt loss.   2. Class 3 severe obesity due to excess calories with serious comorbidity and body mass index (BMI) of 60.0 to 69.9 in adult [facility])     Return in about 4 weeks (around 09/13/2022) for follow up.   Becky Sax, MD

## 2022-09-03 ENCOUNTER — Other Ambulatory Visit: Payer: Self-pay | Admitting: Student

## 2022-09-13 ENCOUNTER — Ambulatory Visit (INDEPENDENT_AMBULATORY_CARE_PROVIDER_SITE_OTHER): Payer: BC Managed Care – PPO | Admitting: Family Medicine

## 2022-09-13 ENCOUNTER — Encounter: Payer: Self-pay | Admitting: Family Medicine

## 2022-09-13 VITALS — BP 137/91 | HR 92 | Temp 97.7°F | Resp 16 | Wt >= 6400 oz

## 2022-09-13 DIAGNOSIS — Z6841 Body Mass Index (BMI) 40.0 and over, adult: Secondary | ICD-10-CM | POA: Diagnosis not present

## 2022-09-13 DIAGNOSIS — Z7689 Persons encountering health services in other specified circumstances: Secondary | ICD-10-CM

## 2022-09-13 MED ORDER — PHENTERMINE HCL 37.5 MG PO CAPS: 37.5000 mg | ORAL_CAPSULE | ORAL | 0 refills | Status: DC

## 2022-09-13 NOTE — Progress Notes (Unsigned)
Patient came in for monthly weight check. Patient has no other concerns today  

## 2022-09-14 ENCOUNTER — Encounter: Payer: Self-pay | Admitting: Family Medicine

## 2022-09-14 NOTE — Progress Notes (Signed)
Established Patient Office Visit  Subjective    Patient ID: Danielle [name], female    DOB: 10/27/1985  Age: 37 y.o. MRN: [id number]  CC: No chief complaint on file.   HPI TOCCARRA KESTEL presents for routine weight management. She reports that she has been having difficulties because she has been moving and having increased social stressors.    Outpatient Encounter Medications as of 09/13/2022  Medication Sig   albuterol (VENTOLIN HFA) 108 (90 Base) MCG/ACT inhaler INHALE 1-2 PUFFS BY MOUTH EVERY 6 HOURS AS NEEDED FOR WHEEZE OR SHORTNESS OF BREATH   budesonide-formoterol (SYMBICORT) 80-4.5 MCG/ACT inhaler INHALE 2 PUFFS INTO THE LUNGS IN THE MORNING AND AT BEDTIME.   fluticasone (FLONASE) 50 MCG/ACT nasal spray 2 sprays into each nostril daily.   pantoprazole (PROTONIX) 40 MG tablet TAKE 1 TABLET (40 MG TOTAL) BY MOUTH DAILY 30 MINUTES BEFORE BREAKFAST   phentermine 37.5 MG capsule Take 1 capsule (37.5 mg total) by mouth every morning.   [DISCONTINUED] famotidine (PEPCID) 10 MG tablet Take 10 mg by mouth 2 (two) times daily.   [DISCONTINUED] phentermine 37.5 MG capsule Take 1 capsule (37.5 mg total) by mouth every morning.   No facility-administered encounter medications on file as of 09/13/2022.    Past Medical History:  Diagnosis Date   Allergy    Asthma     No past surgical history on file.  Family History  Problem Relation Age of Onset   Hypertension Mother    Healthy Father     Social History   Socioeconomic History   Marital status: Single    Spouse name: Not on file   Number of children: Not on file   Years of education: Not on file   Highest education level: Not on file  Occupational History   Not on file  Tobacco Use   Smoking status: Former    Packs/day: 0.10    Years: 5.00    Total pack years: 0.50    Types: Cigarettes    Quit date: 07/06/2007    Years since quitting: 15.2   Smokeless tobacco: Never  Substance and Sexual Activity   Alcohol use:  Yes    Comment: Occasionally   Drug use: No   Sexual activity: Yes    Birth control/protection: I.U.D.  Other Topics Concern   Not on file  Social History Narrative   Not on file   Social Determinants of Health   Financial Resource Strain: Low Risk  (09/13/2022)   Overall Financial Resource Strain (CARDIA)    Difficulty of Paying Living Expenses: Not hard at all  Food Insecurity: No Food Insecurity (09/13/2022)   Hunger Vital Sign    Worried About Running Out of Food in the Last Year: Never true    Ran Out of Food in the Last Year: Never true  Transportation Needs: No Transportation Needs (09/13/2022)   PRAPARE - Hydrologist (Medical): No    Lack of Transportation (Non-Medical): No  Physical Activity: Insufficiently Active (09/13/2022)   Exercise Vital Sign    Days of Exercise per Week: 3 days    Minutes of Exercise per Session: 30 min  Stress: No Stress Concern Present (09/13/2022)   [place]    Feeling of Stress : Not at all  Social Connections: Moderately Integrated (09/13/2022)   Social Connection and Isolation Panel [NHANES]    Frequency of Communication with Friends and Family: More than three  times a week    Frequency of Social Gatherings with Friends and Family: More than three times a week    Attends Religious Services: More than 4 times per year    Active Member of [employer] or Organizations: Yes    Attends Archivist Meetings: More than 4 times per year    Marital Status: Never married  Intimate Partner Violence: Not At Risk (09/13/2022)   Humiliation, Afraid, Rape, and Kick questionnaire    Fear of Current or Ex-Partner: No    Emotionally Abused: No    Physically Abused: No    Sexually Abused: No    Review of Systems  All other systems reviewed and are negative.       Objective    BP (!) 137/91   Pulse 92   Temp 97.7 F (36.5 C) (Oral)   Resp 16   Wt  (!) 423 lb (191.9 kg)   SpO2 98%   BMI 66.25 kg/m   Physical Exam Vitals and nursing note reviewed.  Constitutional:      General: She is not in acute distress.    Appearance: She is obese.  Cardiovascular:     Rate and Rhythm: Normal rate and regular rhythm.  Pulmonary:     Effort: Pulmonary effort is normal.     Breath sounds: Normal breath sounds.  Neurological:     General: No focal deficit present.     Mental Status: She is alert and oriented to person, place, and time.  Psychiatric:        Mood and Affect: Mood is anxious.        Behavior: Behavior normal.         Assessment & Plan:   1. Encounter for weight management Minimal loss. Discussed with patient. If patient does not lose a minimum of 3-4 lbs this next month - will suspend further phentermine until she has seen BH/SW regarding the weight loss process. Patient v.u.  2. Class 3 severe obesity due to excess calories without serious comorbidity with body mass index (BMI) of 60.0 to 69.9 in adult [facility])     Return in about 4 weeks (around 10/11/2022) for follow up.   Becky Sax, MD

## 2022-09-17 ENCOUNTER — Ambulatory Visit: Payer: BC Managed Care – PPO | Admitting: Family Medicine

## 2022-10-19 ENCOUNTER — Ambulatory Visit: Payer: BC Managed Care – PPO | Admitting: Family Medicine

## 2022-11-19 DIAGNOSIS — R5383 Other fatigue: Secondary | ICD-10-CM | POA: Diagnosis not present

## 2022-11-19 DIAGNOSIS — Z13220 Encounter for screening for lipoid disorders: Secondary | ICD-10-CM | POA: Diagnosis not present

## 2022-11-19 DIAGNOSIS — N951 Menopausal and female climacteric states: Secondary | ICD-10-CM | POA: Diagnosis not present

## 2022-11-19 DIAGNOSIS — R232 Flushing: Secondary | ICD-10-CM | POA: Diagnosis not present

## 2022-11-19 DIAGNOSIS — E559 Vitamin D deficiency, unspecified: Secondary | ICD-10-CM | POA: Diagnosis not present

## 2022-11-19 DIAGNOSIS — Z131 Encounter for screening for diabetes mellitus: Secondary | ICD-10-CM | POA: Diagnosis not present

## 2022-11-19 DIAGNOSIS — Z6841 Body Mass Index (BMI) 40.0 and over, adult: Secondary | ICD-10-CM | POA: Diagnosis not present

## 2022-11-19 DIAGNOSIS — J4520 Mild intermittent asthma, uncomplicated: Secondary | ICD-10-CM | POA: Diagnosis not present

## 2022-12-06 DIAGNOSIS — R635 Abnormal weight gain: Secondary | ICD-10-CM | POA: Diagnosis not present

## 2022-12-06 DIAGNOSIS — Z1331 Encounter for screening for depression: Secondary | ICD-10-CM | POA: Diagnosis not present

## 2022-12-06 DIAGNOSIS — E559 Vitamin D deficiency, unspecified: Secondary | ICD-10-CM | POA: Diagnosis not present

## 2022-12-06 DIAGNOSIS — Z6841 Body Mass Index (BMI) 40.0 and over, adult: Secondary | ICD-10-CM | POA: Diagnosis not present

## 2022-12-06 DIAGNOSIS — E038 Other specified hypothyroidism: Secondary | ICD-10-CM | POA: Diagnosis not present

## 2023-01-03 DIAGNOSIS — Z6841 Body Mass Index (BMI) 40.0 and over, adult: Secondary | ICD-10-CM | POA: Diagnosis not present

## 2023-01-03 DIAGNOSIS — R5383 Other fatigue: Secondary | ICD-10-CM | POA: Diagnosis not present

## 2023-01-17 DIAGNOSIS — R5383 Other fatigue: Secondary | ICD-10-CM | POA: Diagnosis not present

## 2023-01-17 DIAGNOSIS — E559 Vitamin D deficiency, unspecified: Secondary | ICD-10-CM | POA: Diagnosis not present

## 2023-01-17 DIAGNOSIS — Z6841 Body Mass Index (BMI) 40.0 and over, adult: Secondary | ICD-10-CM | POA: Diagnosis not present

## 2023-01-17 DIAGNOSIS — E038 Other specified hypothyroidism: Secondary | ICD-10-CM | POA: Diagnosis not present

## 2023-01-31 DIAGNOSIS — Z6841 Body Mass Index (BMI) 40.0 and over, adult: Secondary | ICD-10-CM | POA: Diagnosis not present

## 2023-01-31 DIAGNOSIS — E038 Other specified hypothyroidism: Secondary | ICD-10-CM | POA: Diagnosis not present

## 2023-02-14 DIAGNOSIS — E559 Vitamin D deficiency, unspecified: Secondary | ICD-10-CM | POA: Diagnosis not present

## 2023-02-14 DIAGNOSIS — Z6841 Body Mass Index (BMI) 40.0 and over, adult: Secondary | ICD-10-CM | POA: Diagnosis not present

## 2023-02-21 DIAGNOSIS — R5383 Other fatigue: Secondary | ICD-10-CM | POA: Diagnosis not present

## 2023-02-21 DIAGNOSIS — Z6841 Body Mass Index (BMI) 40.0 and over, adult: Secondary | ICD-10-CM | POA: Diagnosis not present

## 2023-02-21 DIAGNOSIS — E559 Vitamin D deficiency, unspecified: Secondary | ICD-10-CM | POA: Diagnosis not present

## 2023-03-08 DIAGNOSIS — Z6841 Body Mass Index (BMI) 40.0 and over, adult: Secondary | ICD-10-CM | POA: Diagnosis not present

## 2023-03-08 DIAGNOSIS — E559 Vitamin D deficiency, unspecified: Secondary | ICD-10-CM | POA: Diagnosis not present

## 2023-03-14 DIAGNOSIS — E038 Other specified hypothyroidism: Secondary | ICD-10-CM | POA: Diagnosis not present

## 2023-03-14 DIAGNOSIS — Z6841 Body Mass Index (BMI) 40.0 and over, adult: Secondary | ICD-10-CM | POA: Diagnosis not present

## 2023-03-14 DIAGNOSIS — E559 Vitamin D deficiency, unspecified: Secondary | ICD-10-CM | POA: Diagnosis not present

## 2023-03-21 DIAGNOSIS — Z6841 Body Mass Index (BMI) 40.0 and over, adult: Secondary | ICD-10-CM | POA: Diagnosis not present

## 2023-03-21 DIAGNOSIS — R5383 Other fatigue: Secondary | ICD-10-CM | POA: Diagnosis not present

## 2023-03-27 ENCOUNTER — Ambulatory Visit
Admission: RE | Admit: 2023-03-27 | Discharge: 2023-03-27 | Disposition: A | Discharge status: Home / Self Care | Payer: BC Managed Care – PPO | Source: Ambulatory Visit | Attending: Internal Medicine | Admitting: Internal Medicine

## 2023-03-27 VITALS — BP 121/91 | HR 95 | Temp 99.3°F | Ht 67.0 in | Wt >= 6400 oz

## 2023-03-27 DIAGNOSIS — R0989 Other specified symptoms and signs involving the circulatory and respiratory systems: Secondary | ICD-10-CM | POA: Diagnosis not present

## 2023-03-27 MED ORDER — METHYLPREDNISOLONE ACETATE 80 MG/ML IJ SUSP
80.0000 mg | Freq: Once | INTRAMUSCULAR | Status: AC
  Administered 2023-03-27: 80 mg via INTRAMUSCULAR

## 2023-03-27 NOTE — ED Provider Notes (Signed)
[name]    CSN: [id number] Arrival date & time: 03/27/23  1040      History   Chief Complaint No chief complaint on file.   HPI Danielle [name] is a 37 y.o. female.   Patient presents with chest congestion and sinus pressure that started about 1 to 2 days ago.  Denies coughing, runny nose, nasal congestion.  Denies fever or known sick contacts.  Reports that it started after she stayed at a hotel room that seemed to have a lot of moisture in the air.  She has been taking Robitussin and Mucinex.  Reports that she has history of asthma and has been using her albuterol inhaler as needed.     Past Medical History:  Diagnosis Date   Allergy    Asthma     Patient Active Problem List   Diagnosis Date Noted   Allergy to sulfa drugs 07/10/2021   Moderate persistent allergic asthma 07/10/2021   Allergic rhinitis due to allergen 07/10/2021   Encounter for insertion of mirena IUD 04/02/2020   Encounter for Nexplanon removal 04/02/2020   Chronic pansinusitis 08/17/2017   Eustachian tube dysfunction, bilateral 08/17/2017   Perennial allergic rhinitis 08/17/2017   Asthma 05/10/2013    History reviewed. No pertinent surgical history.  OB History   No obstetric history on file.      Home Medications    Prior to Admission medications   Medication Sig Start Date End Date Taking? Authorizing Provider  albuterol (VENTOLIN HFA) 108 (90 Base) MCG/ACT inhaler INHALE 1-2 PUFFS BY MOUTH EVERY 6 HOURS AS NEEDED FOR WHEEZE OR SHORTNESS OF BREATH 07/06/22   Omar Person, MD  budesonide-formoterol [facility]) 80-4.5 MCG/ACT inhaler INHALE 2 PUFFS INTO THE LUNGS IN THE MORNING AND AT BEDTIME. 07/06/22   Omar Person, MD  fluticasone (FLONASE) 50 MCG/ACT nasal spray 2 sprays into each nostril daily. 09/29/16 07/10/22  [provider]  pantoprazole (PROTONIX) 40 MG tablet TAKE 1 TABLET (40 MG TOTAL) BY MOUTH DAILY 30 MINUTES BEFORE BREAKFAST 09/03/22   Omar Person, MD  phentermine 37.5 MG capsule Take 1 capsule (37.5 mg total) by mouth every morning. 09/13/22   Georganna Skeans, MD  famotidine (PEPCID) 10 MG tablet Take 10 mg by mouth 2 (two) times daily.  03/22/20  [provider]    Family History Family History  Problem Relation Age of Onset   Hypertension Mother    Healthy Father     Social History Social History   Tobacco Use   Smoking status: Former    Current packs/day: 0.00    Average packs/day: 0.1 packs/day for 5.0 years (0.5 ttl pk-yrs)    Types: Cigarettes    Start date: 07/05/2002    Quit date: 07/06/2007    Years since quitting: 15.7   Smokeless tobacco: Never  Substance Use Topics   Alcohol use: Yes    Comment: Occasionally   Drug use: No     Allergies   Sulfa antibiotics   Review of Systems Review of Systems Per HPI  Physical Exam Triage Vital Signs ED Triage Vitals  Encounter Vitals Group     BP 03/27/23 1051 (!) 121/91     Systolic BP Percentile --      Diastolic BP Percentile --      Pulse Rate 03/27/23 1051 95     Resp --      Temp 03/27/23 1051 99.3 F (37.4 C)     Temp Source 03/27/23 1051  Oral     SpO2 03/27/23 1051 98 %     Weight 03/27/23 1049 (!) 403 lb (182.8 kg)     Height 03/27/23 1049 5\' 7"  (1.702 m)     Head Circumference --      Peak Flow --      Pain Score 03/27/23 1049 5     Pain Loc --      Pain Education --      Exclude from Growth Chart --    No data found.  Updated Vital Signs BP (!) 121/91 (BP Location: Right Leg)   Pulse 95   Temp 99.3 F (37.4 C) (Oral)   Ht 5\' 7"  (1.702 m)   Wt (!) 403 lb (182.8 kg)   SpO2 98%   BMI 63.12 kg/m   Visual Acuity Right Eye Distance:   Left Eye Distance:   Bilateral Distance:    Right Eye Near:   Left Eye Near:    Bilateral Near:     Physical Exam Constitutional:      General: She is not in acute distress.    Appearance: Normal appearance. She is not toxic-appearing or diaphoretic.  HENT:     Head:  Normocephalic and atraumatic.     Right Ear: Tympanic membrane and ear canal normal.     Left Ear: Tympanic membrane and ear canal normal.     Nose: Congestion present.     Mouth/Throat:     Mouth: Mucous membranes are moist.     Pharynx: No posterior oropharyngeal erythema.  Eyes:     Extraocular Movements: Extraocular movements intact.     Conjunctiva/sclera: Conjunctivae normal.     Pupils: Pupils are equal, round, and reactive to light.  Cardiovascular:     Rate and Rhythm: Normal rate and regular rhythm.     Pulses: Normal pulses.     Heart sounds: Normal heart sounds.  Pulmonary:     Effort: Pulmonary effort is normal. No respiratory distress.     Breath sounds: Normal breath sounds. No stridor. No wheezing, rhonchi or rales.  Abdominal:     General: Abdomen is flat. Bowel sounds are normal.     Palpations: Abdomen is soft.  Musculoskeletal:        General: Normal range of motion.     Cervical back: Normal range of motion.  Skin:    General: Skin is warm and dry.  Neurological:     General: No focal deficit present.     Mental Status: She is alert and oriented to person, place, and time. Mental status is at baseline.  Psychiatric:        Mood and Affect: Mood normal.        Behavior: Behavior normal.      UC Treatments / Results  Labs (all labs ordered are listed, but only abnormal results are displayed) Labs Reviewed - No data to display  EKG   Radiology No results found.  Procedures Procedures (including critical care time)  Medications Ordered in UC Medications  methylPREDNISolone acetate (DEPO-MEDROL) injection 80 mg (80 mg Intramuscular Given 03/27/23 1118)    Initial Impression / Assessment and Plan / UC Course  I have reviewed the triage vital signs and the nursing notes.  Pertinent labs & imaging results that were available during my care of the patient were reviewed by me and considered in my medical decision making (see chart for details).      Differential diagnoses include allergy related symptoms versus viral illness.  Patient declined COVID testing.  There are no adventitious lung sounds on exam and oxygen is normal so do not think that chest imaging is necessary.  Will treat with IM steroid today in urgent care given patient has been using her albuterol inhaler as there is concern for possible mild asthma exacerbation.  Patient to continue albuterol inhaler and Mucinex as needed.  Encouraged her to follow-up if any symptoms persist or worsen.  Patient verbalized understanding and was agreeable with plan. Final Clinical Impressions(s) / UC Diagnoses   Final diagnoses:  Chest congestion     Discharge Instructions      You were given a steroid shot today in urgent care today.  Continue albuterol inhaler as needed.  Follow-up if any symptoms persist or worsen.    ED Prescriptions   None    PDMP not reviewed this encounter.   Gustavus Bryant, [place] 03/27/23 [phone]

## 2023-03-27 NOTE — ED Triage Notes (Signed)
Patient presents with chest congestion and headache x Friday night. Treated with Robitussin, Mucinex.

## 2023-03-27 NOTE — Discharge Instructions (Signed)
You were given a steroid shot today in urgent care today.  Continue albuterol inhaler as needed.  Follow-up if any symptoms persist or worsen.

## 2023-03-29 ENCOUNTER — Ambulatory Visit: Payer: BC Managed Care – PPO

## 2023-04-07 DIAGNOSIS — Z6841 Body Mass Index (BMI) 40.0 and over, adult: Secondary | ICD-10-CM | POA: Diagnosis not present

## 2023-04-07 DIAGNOSIS — E559 Vitamin D deficiency, unspecified: Secondary | ICD-10-CM | POA: Diagnosis not present

## 2023-04-18 IMAGING — DX DG CHEST 2V
2 series · 2 of 2 positions shown · non-contrast
Comparison: None.

CLINICAL DATA: Shortness of breath with exertion

EXAM:
CHEST - 2 VIEW

[chest pa]
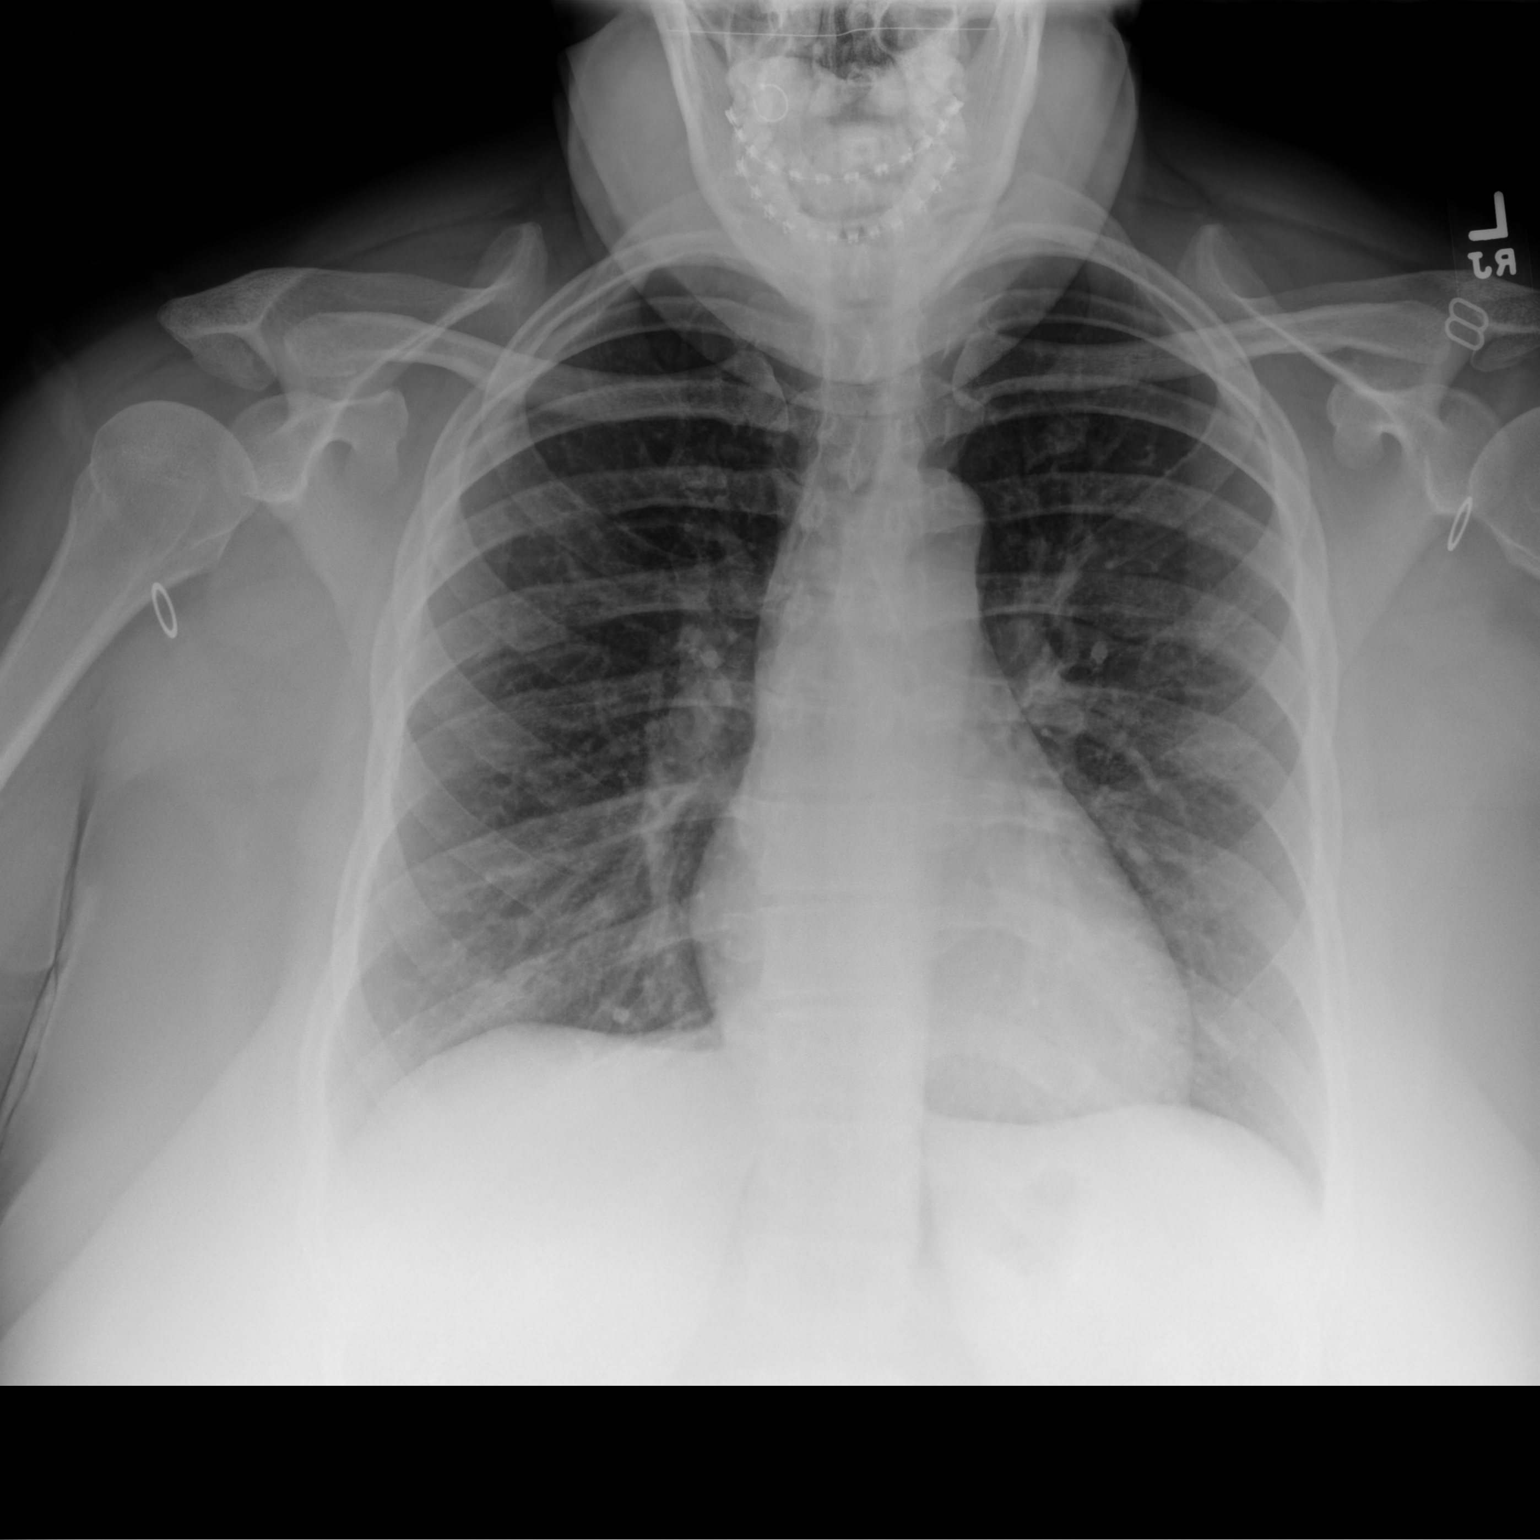

[chest lat]
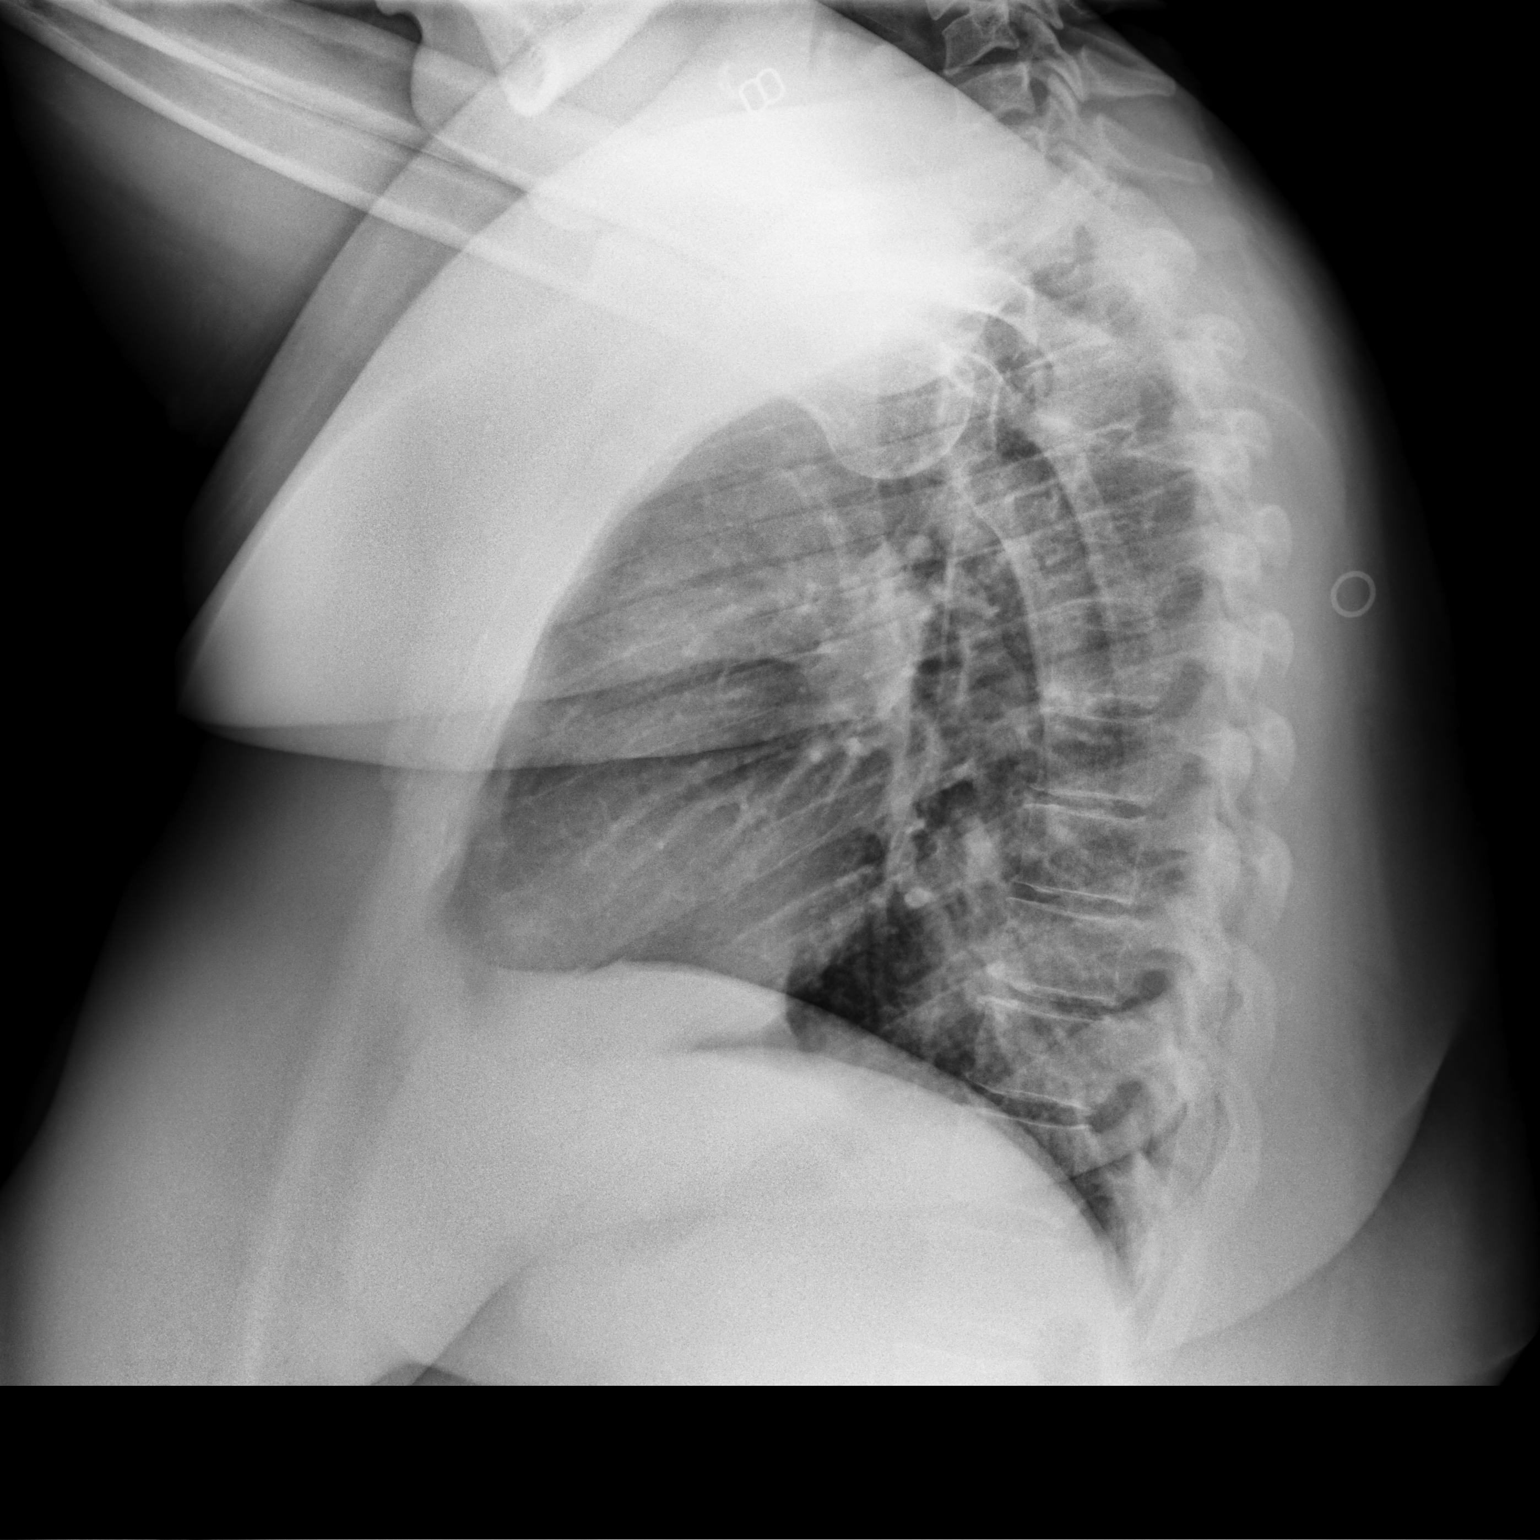

[2 of 2 positions shown; findings below may reference images not displayed]

FINDINGS: Heart and mediastinal contours are within normal limits. No focal
opacities or effusions. No acute bony abnormality.
IMPRESSION: No active cardiopulmonary disease.

## 2023-04-26 DIAGNOSIS — R5383 Other fatigue: Secondary | ICD-10-CM | POA: Diagnosis not present

## 2023-04-26 DIAGNOSIS — E559 Vitamin D deficiency, unspecified: Secondary | ICD-10-CM | POA: Diagnosis not present

## 2023-04-26 DIAGNOSIS — Z6841 Body Mass Index (BMI) 40.0 and over, adult: Secondary | ICD-10-CM | POA: Diagnosis not present

## 2023-04-29 DIAGNOSIS — Z23 Encounter for immunization: Secondary | ICD-10-CM | POA: Diagnosis not present

## 2023-05-03 DIAGNOSIS — R5383 Other fatigue: Secondary | ICD-10-CM | POA: Diagnosis not present

## 2023-05-03 DIAGNOSIS — Z6841 Body Mass Index (BMI) 40.0 and over, adult: Secondary | ICD-10-CM | POA: Diagnosis not present

## 2023-05-06 ENCOUNTER — Telehealth: Payer: Self-pay | Admitting: Family Medicine

## 2023-05-06 NOTE — Telephone Encounter (Signed)
Called pt and left vm to call office back to schedule appt requested via MyChart for weight management

## 2023-06-09 ENCOUNTER — Ambulatory Visit: Payer: BC Managed Care – PPO | Admitting: Family Medicine

## 2023-06-09 ENCOUNTER — Encounter: Payer: Self-pay | Admitting: Family Medicine

## 2023-06-09 VITALS — BP 135/87 | HR 81 | Temp 98.7°F | Resp 16 | Ht 67.0 in | Wt 398.8 lb

## 2023-06-09 DIAGNOSIS — E66813 Obesity, class 3: Secondary | ICD-10-CM

## 2023-06-09 DIAGNOSIS — Z7689 Persons encountering health services in other specified circumstances: Secondary | ICD-10-CM

## 2023-06-09 DIAGNOSIS — Z6841 Body Mass Index (BMI) 40.0 and over, adult: Secondary | ICD-10-CM

## 2023-06-09 MED ORDER — WEGOVY 1 MG/0.5ML ~~LOC~~ SOAJ: 1.0000 mg | SUBCUTANEOUS | 0 refills | Status: DC

## 2023-06-09 NOTE — Progress Notes (Signed)
Established Patient Office Visit  Subjective    Patient ID: Danielle [name], female    DOB: 1985-07-24  Age: 37 y.o. MRN: [id number]  CC:  Chief Complaint  Patient presents with   Weight Management Screening    HPI Danielle [name] presents for routine weight managemnet. Patient reports that   Outpatient Encounter Medications as of 06/09/2023  Medication Sig   albuterol (VENTOLIN HFA) 108 (90 Base) MCG/ACT inhaler INHALE 1-2 PUFFS BY MOUTH EVERY 6 HOURS AS NEEDED FOR WHEEZE OR SHORTNESS OF BREATH   etonogestrel (NEXPLANON) 68 MG IMPL implant Inject into the skin.   liothyronine (CYTOMEL) 5 MCG tablet    budesonide-formoterol (SYMBICORT) 80-4.5 MCG/ACT inhaler INHALE 2 PUFFS INTO THE LUNGS IN THE MORNING AND AT BEDTIME.   fluticasone (FLONASE) 50 MCG/ACT nasal spray 2 sprays into each nostril daily.   pantoprazole (PROTONIX) 40 MG tablet TAKE 1 TABLET (40 MG TOTAL) BY MOUTH DAILY 30 MINUTES BEFORE BREAKFAST   phentermine 37.5 MG capsule Take 1 capsule (37.5 mg total) by mouth every morning.   [DISCONTINUED] famotidine (PEPCID) 10 MG tablet Take 10 mg by mouth 2 (two) times daily.   No facility-administered encounter medications on file as of 06/09/2023.    Past Medical History:  Diagnosis Date   Allergy    Asthma     History reviewed. No pertinent surgical history.  Family History  Problem Relation Age of Onset   Hypertension Mother    Healthy Father     Social History   Socioeconomic History   Marital status: Single    Spouse name: Not on file   Number of children: Not on file   Years of education: Not on file   Highest education level: Bachelor's degree (e.g., BA, AB, BS)  Occupational History   Not on file  Tobacco Use   Smoking status: Former    Current packs/day: 0.00    Average packs/day: 0.1 packs/day for 5.0 years (0.5 ttl pk-yrs)    Types: Cigarettes    Start date: 07/05/2002    Quit date: 07/06/2007    Years since quitting: 15.9   Smokeless tobacco:  Never  Substance and Sexual Activity   Alcohol use: Yes    Comment: Occasionally   Drug use: No   Sexual activity: Yes    Birth control/protection: I.U.D.  Other Topics Concern   Not on file  Social History Narrative   Not on file   Social Determinants of Health   Financial Resource Strain: Low Risk  (06/05/2023)   Overall Financial Resource Strain (CARDIA)    Difficulty of Paying Living Expenses: Not hard at all  Food Insecurity: No Food Insecurity (06/05/2023)   Hunger Vital Sign    Worried About Running Out of Food in the Last Year: Never true    Ran Out of Food in the Last Year: Never true  Transportation Needs: No Transportation Needs (06/05/2023)   PRAPARE - Administrator, Civil Service (Medical): No    Lack of Transportation (Non-Medical): No  Physical Activity: Insufficiently Active (06/05/2023)   Exercise Vital Sign    Days of Exercise per Week: 2 days    Minutes of Exercise per Session: 30 min  Stress: No Stress Concern Present (06/05/2023)   [employer] of Occupational Health - Occupational Stress Questionnaire    Feeling of Stress : Only a little  Social Connections: Moderately Integrated (06/05/2023)   Social Connection and Isolation Panel [NHANES]    Frequency of Communication with  Friends and Family: More than three times a week    Frequency of Social Gatherings with Friends and Family: Patient declined    Attends Religious Services: More than 4 times per year    Active Member of [employer] or Organizations: No    Attends Engineer, structural: More than 4 times per year    Marital Status: Never married  Intimate Partner Violence: Not At Risk (09/13/2022)   Humiliation, Afraid, Rape, and Kick questionnaire    Fear of Current or Ex-Partner: No    Emotionally Abused: No    Physically Abused: No    Sexually Abused: No    Review of Systems  All other systems reviewed and are negative.       Objective    BP 135/87 (BP Location: Right  Arm, Patient Position: Sitting, Cuff Size: Normal)   Pulse 81   Temp 98.7 F (37.1 C) (Oral)   Resp 16   Ht 5\' 7"  (1.702 m)   Wt (!) 398 lb 12.8 oz (180.9 kg)   SpO2 96%   BMI 62.46 kg/m   Physical Exam Vitals and nursing note reviewed.  Constitutional:      General: She is not in acute distress.    Appearance: She is obese.  Cardiovascular:     Rate and Rhythm: Normal rate and regular rhythm.  Pulmonary:     Effort: Pulmonary effort is normal.     Breath sounds: Normal breath sounds.  Neurological:     General: No focal deficit present.     Mental Status: She is alert and oriented to person, place, and time.  Psychiatric:        Behavior: Behavior normal.         Assessment & Plan:   1. Encounter for weight management Continues to lose with present management.   2. Class 3 severe obesity due to excess calories without serious comorbidity with body mass index (BMI) of 60.0 to 69.9 in adult [facility]) Wegovy prescribed (1mg )  No follow-ups on file.   Tommie Raymond, MD

## 2023-06-13 ENCOUNTER — Encounter: Payer: Self-pay | Admitting: Family Medicine

## 2023-06-15 ENCOUNTER — Other Ambulatory Visit: Payer: Self-pay

## 2023-06-16 ENCOUNTER — Telehealth: Payer: Self-pay | Admitting: Family Medicine

## 2023-06-16 NOTE — Telephone Encounter (Signed)
Pt called in says needs PA for, Semaglutide-Weight Management (WEGOVY) 1 MG/0.5ML SOAJ

## 2023-06-17 NOTE — Telephone Encounter (Signed)
Patient called  with no answer/MBF. PA was sent in  on 06/15/2023,awaiting approval.

## 2023-06-17 NOTE — Telephone Encounter (Signed)
Patient returned call and is aware PA was sent on 12/11 and waiting on approval

## 2023-06-20 ENCOUNTER — Other Ambulatory Visit: Payer: Self-pay

## 2023-06-20 NOTE — Telephone Encounter (Signed)
[employer] faxed additional PA request for more information to office. Completed forms and faxed back today 06/20/2023. Faxed forms to [phone]. Uploaded to media. Case # [id number]

## 2023-06-21 DIAGNOSIS — R5383 Other fatigue: Secondary | ICD-10-CM | POA: Diagnosis not present

## 2023-06-21 DIAGNOSIS — E039 Hypothyroidism, unspecified: Secondary | ICD-10-CM | POA: Diagnosis not present

## 2023-06-21 DIAGNOSIS — Z6841 Body Mass Index (BMI) 40.0 and over, adult: Secondary | ICD-10-CM | POA: Diagnosis not present

## 2023-06-22 ENCOUNTER — Other Ambulatory Visit: Payer: Self-pay

## 2023-06-24 ENCOUNTER — Other Ambulatory Visit: Payer: Self-pay

## 2023-07-07 ENCOUNTER — Other Ambulatory Visit: Payer: Self-pay | Admitting: Family Medicine

## 2023-07-07 ENCOUNTER — Other Ambulatory Visit: Payer: Self-pay

## 2023-07-07 ENCOUNTER — Telehealth: Payer: Self-pay

## 2023-07-07 NOTE — Telephone Encounter (Signed)
 Pharmacy Patient Advocate Encounter   Received notification from Pt Calls Messages that prior authorization for WEGOVY  is required/requested.   Insurance verification completed.   The patient is insured through [facility] .   Per test claim: PA required; PA submitted to above mentioned insurance via CoverMyMeds Key/confirmation #/EOC [id number] Status is pending

## 2023-07-08 ENCOUNTER — Other Ambulatory Visit: Payer: Self-pay

## 2023-07-08 NOTE — Telephone Encounter (Signed)
 Pharmacy Patient Advocate Encounter  Received notification from [facility] that Prior Authorization for [facility] has been APPROVED from 07/07/2023 to 01/04/2024   PA #/Case ID/Reference #: [id number]

## 2023-07-14 ENCOUNTER — Ambulatory Visit (INDEPENDENT_AMBULATORY_CARE_PROVIDER_SITE_OTHER): Payer: BC Managed Care – PPO | Admitting: Family Medicine

## 2023-07-14 ENCOUNTER — Encounter: Payer: Self-pay | Admitting: Family Medicine

## 2023-07-14 VITALS — BP 110/82 | HR 80 | Resp 16 | Wt 395.0 lb

## 2023-07-14 DIAGNOSIS — Z6841 Body Mass Index (BMI) 40.0 and over, adult: Secondary | ICD-10-CM | POA: Diagnosis not present

## 2023-07-14 DIAGNOSIS — E66813 Obesity, class 3: Secondary | ICD-10-CM | POA: Diagnosis not present

## 2023-07-14 DIAGNOSIS — Z7689 Persons encountering health services in other specified circumstances: Secondary | ICD-10-CM

## 2023-07-14 MED ORDER — WEGOVY 1.7 MG/0.75ML ~~LOC~~ SOAJ: 1.7000 mg | SUBCUTANEOUS | 0 refills | Status: DC

## 2023-07-14 NOTE — Progress Notes (Signed)
 Established Patient Office Visit  Subjective    Patient ID: Danielle [name], female    DOB: [birth year]  Age: 38 y.o. MRN: [id number]  CC:  Chief Complaint  Patient presents with   Weight Check    HPI Danielle [name] presents for monthly weight management. Patient denies   Outpatient Encounter Medications as of 07/14/2023  Medication Sig   liothyronine (CYTOMEL) 5 MCG tablet    Semaglutide -Weight Management (WEGOVY ) 1 MG/0.5ML SOAJ Inject 1 mg into the skin once a week.   Semaglutide -Weight Management (WEGOVY ) 1.7 MG/0.75ML SOAJ Inject 1.7 mg into the skin once a week.   albuterol  (VENTOLIN  HFA) 108 (90 Base) MCG/ACT inhaler INHALE 1-2 PUFFS BY MOUTH EVERY 6 HOURS AS NEEDED FOR WHEEZE OR SHORTNESS OF BREATH   etonogestrel (NEXPLANON) 68 MG IMPL implant Inject into the skin.   fluticasone (FLONASE) 50 MCG/ACT nasal spray 2 sprays into each nostril daily.   [DISCONTINUED] famotidine (PEPCID) 10 MG tablet Take 10 mg by mouth 2 (two) times daily.   No facility-administered encounter medications on file as of 07/14/2023.    Past Medical History:  Diagnosis Date   Allergy    Asthma     No past surgical history on file.  Family History  Problem Relation Age of Onset   Hypertension Mother    Healthy Father     Social History   Socioeconomic History   Marital status: Single    Spouse name: Not on file   Number of children: Not on file   Years of education: Not on file   Highest education level: Master's degree (e.g., MA, MS, MEng, MEd, MSW, MBA)  Occupational History   Not on file  Tobacco Use   Smoking status: Former    Current packs/day: 0.00    Average packs/day: 0.1 packs/day for 5.0 years (0.5 ttl pk-yrs)    Types: Cigarettes    Start date: 07/05/2002    Quit date: 07/06/2007    Years since quitting: 16.0   Smokeless tobacco: Never  Substance and Sexual Activity   Alcohol use: Yes    Comment: Occasionally   Drug use: No   Sexual activity: Yes    Birth  control/protection: I.U.D.  Other Topics Concern   Not on file  Social History Narrative   Not on file   Social Drivers of Health   Financial Resource Strain: Low Risk  (07/10/2023)   Overall Financial Resource Strain (CARDIA)    Difficulty of Paying Living Expenses: Not hard at all  Food Insecurity: No Food Insecurity (07/10/2023)   Hunger Vital Sign    Worried About Running Out of Food in the Last Year: Never true    Ran Out of Food in the Last Year: Never true  Transportation Needs: No Transportation Needs (07/10/2023)   PRAPARE - Administrator, Civil Service (Medical): No    Lack of Transportation (Non-Medical): No  Physical Activity: Insufficiently Active (07/10/2023)   Exercise Vital Sign    Days of Exercise per Week: 2 days    Minutes of Exercise per Session: 10 min  Stress: No Stress Concern Present (07/10/2023)   [employer] of Occupational Health - Occupational Stress Questionnaire    Feeling of Stress : Only a little  Social Connections: Moderately Integrated (07/10/2023)   Social Connection and Isolation Panel [NHANES]    Frequency of Communication with Friends and Family: Three times a week    Frequency of Social Gatherings with Friends and Family: Patient declined  Attends Religious Services: More than 4 times per year    Active Member of Clubs or Organizations: No    Attends Banker Meetings: More than 4 times per year    Marital Status: Never married  Intimate Partner Violence: Not At Risk (09/13/2022)   Humiliation, Afraid, Rape, and Kick questionnaire    Fear of Current or Ex-Partner: No    Emotionally Abused: No    Physically Abused: No    Sexually Abused: No    Review of Systems  All other systems reviewed and are negative.       Objective    BP 110/82   Pulse 80   Resp 16   Wt (!) 395 lb (179.2 kg)   SpO2 95%   BMI 61.87 kg/m   Physical Exam Vitals and nursing note reviewed.  Constitutional:      General: She is  not in acute distress.    Appearance: She is obese.  Cardiovascular:     Rate and Rhythm: Normal rate and regular rhythm.  Pulmonary:     Effort: Pulmonary effort is normal.     Breath sounds: Normal breath sounds.  Neurological:     General: No focal deficit present.     Mental Status: She is alert and oriented to person, place, and time.  Psychiatric:        Behavior: Behavior normal.         Assessment & Plan:   Encounter for weight management  Class 3 severe obesity due to excess calories without serious comorbidity with body mass index (BMI) of 60.0 to 69.9 in adult [facility])  Other orders -     Wegovy ; Inject 1.7 mg into the skin once a week.  Dispense: 3 mL; Refill: 0     Return in about 4 weeks (around 08/11/2023) for follow up, weight management.   Tanda Raguel SQUIBB, MD

## 2023-07-14 NOTE — Progress Notes (Signed)
Patient came in for monthly weight check. Patient has no other concerns today

## 2023-08-15 ENCOUNTER — Ambulatory Visit: Payer: BC Managed Care – PPO | Admitting: Family Medicine

## 2023-08-19 DIAGNOSIS — Z23 Encounter for immunization: Secondary | ICD-10-CM | POA: Diagnosis not present

## 2023-08-19 DIAGNOSIS — Z113 Encounter for screening for infections with a predominantly sexual mode of transmission: Secondary | ICD-10-CM | POA: Diagnosis not present

## 2023-08-19 DIAGNOSIS — Z01419 Encounter for gynecological examination (general) (routine) without abnormal findings: Secondary | ICD-10-CM | POA: Diagnosis not present

## 2023-08-25 ENCOUNTER — Ambulatory Visit (INDEPENDENT_AMBULATORY_CARE_PROVIDER_SITE_OTHER): Payer: BC Managed Care – PPO | Admitting: Family Medicine

## 2023-08-25 VITALS — BP 113/79 | HR 86 | Temp 98.1°F | Resp 20 | Wt 396.0 lb

## 2023-08-25 DIAGNOSIS — Z6841 Body Mass Index (BMI) 40.0 and over, adult: Secondary | ICD-10-CM | POA: Diagnosis not present

## 2023-08-25 DIAGNOSIS — Z7689 Persons encountering health services in other specified circumstances: Secondary | ICD-10-CM

## 2023-08-25 DIAGNOSIS — E66813 Obesity, class 3: Secondary | ICD-10-CM

## 2023-08-25 MED ORDER — WEGOVY 2.4 MG/0.75ML ~~LOC~~ SOAJ: 2.4000 mg | SUBCUTANEOUS | 0 refills | Status: DC

## 2023-08-25 NOTE — Progress Notes (Unsigned)
Patient came in for monthly weight check. Patient has no other concerns today

## 2023-08-26 ENCOUNTER — Encounter: Payer: Self-pay | Admitting: Family Medicine

## 2023-08-26 NOTE — Progress Notes (Signed)
 Established Patient Office Visit  Subjective    Patient ID: Danielle [name], female    DOB: 12/15/1985  Age: 38 y.o. MRN: [id number]  CC:  Chief Complaint  Patient presents with   Weight Check    HPI Danielle [name] presents for routine weight management. Patient denies acute complaints.   Outpatient Encounter Medications as of 08/25/2023  Medication Sig   Semaglutide-Weight Management (WEGOVY) 2.4 MG/0.75ML SOAJ Inject 2.4 mg into the skin once a week.   fluticasone (FLONASE) 50 MCG/ACT nasal spray 2 sprays into each nostril daily.   liothyronine (CYTOMEL) 5 MCG tablet    Semaglutide-Weight Management (WEGOVY) 1.7 MG/0.75ML SOAJ Inject 1.7 mg into the skin once a week.   [DISCONTINUED] famotidine (PEPCID) 10 MG tablet Take 10 mg by mouth 2 (two) times daily.   [DISCONTINUED] Semaglutide-Weight Management (WEGOVY) 1 MG/0.5ML SOAJ Inject 1 mg into the skin once a week.   No facility-administered encounter medications on file as of 08/25/2023.    Past Medical History:  Diagnosis Date   Allergy    Asthma     History reviewed. No pertinent surgical history.  Family History  Problem Relation Age of Onset   Hypertension Mother    Healthy Father     Social History   Socioeconomic History   Marital status: Single    Spouse name: Not on file   Number of children: Not on file   Years of education: Not on file   Highest education level: Master's degree (e.g., MA, MS, MEng, MEd, MSW, MBA)  Occupational History   Not on file  Tobacco Use   Smoking status: Former    Current packs/day: 0.00    Average packs/day: 0.1 packs/day for 5.0 years (0.5 ttl pk-yrs)    Types: Cigarettes    Start date: 07/05/2002    Quit date: 07/06/2007    Years since quitting: 16.1   Smokeless tobacco: Never  Substance and Sexual Activity   Alcohol use: Yes    Comment: Occasionally   Drug use: No   Sexual activity: Yes    Birth control/protection: I.U.D.  Other Topics Concern   Not on file   Social History Narrative   Not on file   Social Drivers of Health   Financial Resource Strain: Low Risk  (07/10/2023)   Overall Financial Resource Strain (CARDIA)    Difficulty of Paying Living Expenses: Not hard at all  Food Insecurity: No Food Insecurity (07/10/2023)   Hunger Vital Sign    Worried About Running Out of Food in the Last Year: Never true    Ran Out of Food in the Last Year: Never true  Transportation Needs: No Transportation Needs (07/10/2023)   PRAPARE - Administrator, Civil Service (Medical): No    Lack of Transportation (Non-Medical): No  Physical Activity: Insufficiently Active (08/19/2023)   Received from [facility]   Exercise Vital Sign    Days of Exercise per Week: 1 day    Minutes of Exercise per Session: 30 min  Stress: No Stress Concern Present (07/10/2023)   [employer] of Occupational Health - Occupational Stress Questionnaire    Feeling of Stress : Only a little  Social Connections: Moderately Integrated (07/10/2023)   Social Connection and Isolation Panel [NHANES]    Frequency of Communication with Friends and Family: Three times a week    Frequency of Social Gatherings with Friends and Family: Patient declined    Attends Religious Services: More than 4 times per  year    Active Member of Clubs or Organizations: No    Attends Banker Meetings: More than 4 times per year    Marital Status: Never married  Intimate Partner Violence: Not At Risk (08/19/2023)   Received from [facility]   Humiliation, Afraid, Rape, and Kick questionnaire    Fear of Current or Ex-Partner: No    Emotionally Abused: No    Physically Abused: No    Sexually Abused: No    Review of Systems  All other systems reviewed and are negative.       Objective    BP 113/79   Pulse 86   Temp 98.1 F (36.7 C) (Oral)   Resp 20   Wt (!) 369 lb 6.4 oz (167.6 kg)   SpO2 95%   BMI 57.86 kg/m   Physical Exam Vitals and nursing note reviewed.   Constitutional:      General: She is not in acute distress. Cardiovascular:     Rate and Rhythm: Normal rate and regular rhythm.  Pulmonary:     Effort: Pulmonary effort is normal.     Breath sounds: Normal breath sounds.  Abdominal:     Palpations: Abdomen is soft.     Tenderness: There is no abdominal tenderness.  Neurological:     General: No focal deficit present.     Mental Status: She is alert and oriented to person, place, and time.         Assessment & Plan:  1. Encounter for weight management (Primary) Wegovy prescribed.   2. Class 3 severe obesity due to excess calories with serious comorbidity and body mass index (BMI) of 50.0 to 59.9 in adult [facility])    Return in about 4 weeks (around 09/22/2023) for follow up.   Tommie Raymond, MD

## 2023-09-26 ENCOUNTER — Other Ambulatory Visit: Payer: Self-pay | Admitting: Family Medicine

## 2023-09-28 ENCOUNTER — Encounter: Payer: Self-pay | Admitting: Family Medicine

## 2023-09-28 ENCOUNTER — Other Ambulatory Visit: Payer: Self-pay | Admitting: Family Medicine

## 2023-09-28 ENCOUNTER — Ambulatory Visit (INDEPENDENT_AMBULATORY_CARE_PROVIDER_SITE_OTHER): Payer: BC Managed Care – PPO | Admitting: Family Medicine

## 2023-09-28 VITALS — BP 131/83 | HR 86 | Temp 98.1°F | Resp 16 | Ht 67.0 in | Wt 394.8 lb

## 2023-09-28 DIAGNOSIS — E66813 Obesity, class 3: Secondary | ICD-10-CM

## 2023-09-28 DIAGNOSIS — Z7689 Persons encountering health services in other specified circumstances: Secondary | ICD-10-CM

## 2023-09-28 DIAGNOSIS — Z6841 Body Mass Index (BMI) 40.0 and over, adult: Secondary | ICD-10-CM

## 2023-09-28 MED ORDER — WEGOVY 2.4 MG/0.75ML ~~LOC~~ SOAJ: 2.4000 mg | SUBCUTANEOUS | 0 refills | Status: DC

## 2023-09-28 MED ORDER — ZEPBOUND 7.5 MG/0.5ML ~~LOC~~ SOLN: 7.5000 mg | SUBCUTANEOUS | 0 refills | Status: DC

## 2023-09-28 NOTE — Progress Notes (Unsigned)
 Established Patient Office Visit  Subjective    Patient ID: Danielle [name], female    DOB: Jun 15, 1986  Age: 38 y.o. MRN: [id number]  CC:  Chief Complaint  Patient presents with   Follow-up    One month    HPI Danielle [name] presents for routine weight management. Patient has not consistent in her diet and activity options. Patient denies acute complaints.   Outpatient Encounter Medications as of 09/28/2023  Medication Sig   liothyronine (CYTOMEL) 5 MCG tablet    [DISCONTINUED] Semaglutide-Weight Management (WEGOVY) 2.4 MG/0.75ML SOAJ INJECT 2.4 MG INTO THE SKIN ONCE A WEEK.   [DISCONTINUED] tirzepatide (ZEPBOUND) 7.5 MG/0.5ML injection vial Inject 7.5 mg into the skin once a week.   fluticasone (FLONASE) 50 MCG/ACT nasal spray 2 sprays into each nostril daily.   Semaglutide-Weight Management (WEGOVY) 2.4 MG/0.75ML SOAJ Inject 2.4 mg into the skin once a week.   [DISCONTINUED] famotidine (PEPCID) 10 MG tablet Take 10 mg by mouth 2 (two) times daily.   [DISCONTINUED] Semaglutide-Weight Management (WEGOVY) 1.7 MG/0.75ML SOAJ Inject 1.7 mg into the skin once a week.   No facility-administered encounter medications on file as of 09/28/2023.    Past Medical History:  Diagnosis Date   Allergy    Asthma     History reviewed. No pertinent surgical history.  Family History  Problem Relation Age of Onset   Hypertension Mother    Healthy Father     Social History   Socioeconomic History   Marital status: Single    Spouse name: Not on file   Number of children: Not on file   Years of education: Not on file   Highest education level: Master's degree (e.g., MA, MS, MEng, MEd, MSW, MBA)  Occupational History   Not on file  Tobacco Use   Smoking status: Former    Current packs/day: 0.00    Average packs/day: 0.1 packs/day for 5.0 years (0.5 ttl pk-yrs)    Types: Cigarettes    Start date: 07/05/2002    Quit date: 07/06/2007    Years since quitting: 16.2   Smokeless tobacco:  Never  Vaping Use   Vaping status: Some Days   Start date: 07/05/2021  Substance and Sexual Activity   Alcohol use: Yes    Comment: Occasionally   Drug use: No   Sexual activity: Yes    Birth control/protection: I.U.D.  Other Topics Concern   Not on file  Social History Narrative   Not on file   Social Drivers of Health   Financial Resource Strain: Low Risk  (07/10/2023)   Overall Financial Resource Strain (CARDIA)    Difficulty of Paying Living Expenses: Not hard at all  Food Insecurity: No Food Insecurity (07/10/2023)   Hunger Vital Sign    Worried About Running Out of Food in the Last Year: Never true    Ran Out of Food in the Last Year: Never true  Transportation Needs: No Transportation Needs (07/10/2023)   PRAPARE - Administrator, Civil Service (Medical): No    Lack of Transportation (Non-Medical): No  Physical Activity: Insufficiently Active (08/19/2023)   Received from [facility]   Exercise Vital Sign    Days of Exercise per Week: 1 day    Minutes of Exercise per Session: 30 min  Stress: No Stress Concern Present (07/10/2023)   [employer] of Occupational Health - Occupational Stress Questionnaire    Feeling of Stress : Only a little  Social Connections: Moderately Isolated (  07/10/2023)   Social Connection and Isolation Panel [NHANES]    Frequency of Communication with Friends and Family: Three times a week    Frequency of Social Gatherings with Friends and Family: Patient declined    Attends Religious Services: More than 4 times per year    Active Member of [employer] or Organizations: No    Attends Engineer, structural: Not on file    Marital Status: Never married  Intimate Partner Violence: Not At Risk (08/19/2023)   Received from [facility]   Humiliation, Afraid, Rape, and Kick questionnaire    Fear of Current or Ex-Partner: No    Emotionally Abused: No    Physically Abused: No    Sexually Abused: No    Review of Systems  All other  systems reviewed and are negative.       Objective    BP 131/83   Pulse 86   Temp 98.1 F (36.7 C) (Oral)   Resp 16   Ht 5\' 7"  (1.702 m)   Wt (!) 394 lb 12.8 oz (179.1 kg)   SpO2 96%   BMI 61.83 kg/m   Physical Exam Vitals and nursing note reviewed.  Constitutional:      General: She is not in acute distress. Cardiovascular:     Rate and Rhythm: Normal rate and regular rhythm.  Pulmonary:     Effort: Pulmonary effort is normal.     Breath sounds: Normal breath sounds.  Abdominal:     Palpations: Abdomen is soft.     Tenderness: There is no abdominal tenderness.  Neurological:     General: No focal deficit present.     Mental Status: She is alert and oriented to person, place, and time.         Assessment & Plan:   Encounter for weight management  Class 3 severe obesity due to excess calories with serious comorbidity and body mass index (BMI) of 60.0 to 69.9 in adult [facility])  Other orders -     ZOXWRU; Inject 2.4 mg into the skin once a week.  Dispense: 3 mL; Refill: 0     Return in about 4 weeks (around 10/26/2023) for follow up, weight management.   Tommie Raymond, MD

## 2023-09-29 ENCOUNTER — Encounter: Payer: Self-pay | Admitting: Family Medicine

## 2023-10-17 DIAGNOSIS — Z23 Encounter for immunization: Secondary | ICD-10-CM | POA: Diagnosis not present

## 2023-10-20 DIAGNOSIS — F411 Generalized anxiety disorder: Secondary | ICD-10-CM | POA: Diagnosis not present

## 2023-10-31 ENCOUNTER — Encounter: Payer: Self-pay | Admitting: Family

## 2023-10-31 ENCOUNTER — Ambulatory Visit (INDEPENDENT_AMBULATORY_CARE_PROVIDER_SITE_OTHER): Admitting: Family

## 2023-10-31 VITALS — BP 113/78 | HR 90 | Temp 98.7°F | Resp 18 | Ht 67.0 in | Wt 394.2 lb

## 2023-10-31 DIAGNOSIS — Z6841 Body Mass Index (BMI) 40.0 and over, adult: Secondary | ICD-10-CM | POA: Diagnosis not present

## 2023-10-31 DIAGNOSIS — Z7689 Persons encountering health services in other specified circumstances: Secondary | ICD-10-CM | POA: Diagnosis not present

## 2023-10-31 DIAGNOSIS — E6609 Other obesity due to excess calories: Secondary | ICD-10-CM | POA: Diagnosis not present

## 2023-10-31 MED ORDER — WEGOVY 2.4 MG/0.75ML ~~LOC~~ SOAJ: 2.4000 mg | SUBCUTANEOUS | 0 refills | Status: DC

## 2023-10-31 NOTE — Progress Notes (Signed)
 Weight management

## 2023-10-31 NOTE — Progress Notes (Signed)
 Patient ID: Danielle [name], female    DOB: January 25, 1986  MRN: [id number]  CC: Weight Check  Subjective: Danielle [name] is a 38 y.o. female who presents for weight check.   Her concerns today include:  - Doing well on Semaglutide -Weight Management, no issues/concerns. She is watching what she eats. She exercises.  - States established with Gynecology and was told that she is not in pre-menopause.  - States established with Psychiatry and was told that she does have depression. States she was prescribed Bupropion and Trazodone. States she did see where Bupropion can suppress appetite and wants to know if it is ok for her to take Bupropion while being on Semaglutide -Weight Management which also suppresses appetite. I discussed in detail with patient due to her not experiencing any side effects from taking Bupropion and Semaglutide -Weight Management she can continue and to follow-up with her primary provider as needed. Patient verbalized understanding and agreement. She denies thoughts of self-harm, suicidal ideations, homicidal ideations.  Patient Active Problem List   Diagnosis Date Noted   Allergy to sulfa drugs 07/10/2021   Moderate persistent allergic asthma 07/10/2021   Allergic rhinitis due to allergen 07/10/2021   Encounter for insertion of Mirena IUD 04/02/2020   Encounter for Nexplanon removal 04/02/2020   Chronic pansinusitis 08/17/2017   Eustachian tube dysfunction, bilateral 08/17/2017   Perennial allergic rhinitis 08/17/2017   Asthma 05/10/2013     Current Outpatient Medications on File Prior to Visit  Medication Sig Dispense Refill   buPROPion (WELLBUTRIN XL) 150 MG 24 hr tablet 150 mg.     liothyronine (CYTOMEL) 5 MCG tablet      traZODone (DESYREL) 50 MG tablet      fluticasone (FLONASE) 50 MCG/ACT nasal spray 2 sprays into each nostril daily.     [DISCONTINUED] famotidine (PEPCID) 10 MG tablet Take 10 mg by mouth 2 (two) times daily.     No current  facility-administered medications on file prior to visit.    Allergies  Allergen Reactions   Sulfa Antibiotics Rash    Social History   Socioeconomic History   Marital status: Single    Spouse name: Not on file   Number of children: Not on file   Years of education: Not on file   Highest education level: Master's degree (e.g., MA, MS, MEng, MEd, MSW, MBA)  Occupational History   Not on file  Tobacco Use   Smoking status: Former    Current packs/day: 0.00    Average packs/day: 0.1 packs/day for 5.0 years (0.5 ttl pk-yrs)    Types: Cigarettes    Start date: 07/05/2002    Quit date: 07/06/2007    Years since quitting: 16.3   Smokeless tobacco: Never  Vaping Use   Vaping status: Some Days   Start date: 07/05/2021  Substance and Sexual Activity   Alcohol use: Yes    Comment: Occasionally   Drug use: No   Sexual activity: Yes    Birth control/protection: I.U.D.  Other Topics Concern   Not on file  Social History Narrative   Not on file   Social Drivers of Health   Financial Resource Strain: Low Risk  (07/10/2023)   Overall Financial Resource Strain (CARDIA)    Difficulty of Paying Living Expenses: Not hard at all  Food Insecurity: No Food Insecurity (07/10/2023)   Hunger Vital Sign    Worried About Running Out of Food in the Last Year: Never true    Ran Out of Food in the Last  Year: Never true  Transportation Needs: No Transportation Needs (07/10/2023)   PRAPARE - Administrator, Civil Service (Medical): No    Lack of Transportation (Non-Medical): No  Physical Activity: Insufficiently Active (08/19/2023)   Received from [facility]   Exercise Vital Sign    Days of Exercise per Week: 1 day    Minutes of Exercise per Session: 30 min  Stress: No Stress Concern Present (07/10/2023)   [employer] of Occupational Health - Occupational Stress Questionnaire    Feeling of Stress : Only a little  Social Connections: Moderately Isolated (07/10/2023)   Social  Connection and Isolation Panel [NHANES]    Frequency of Communication with Friends and Family: Three times a week    Frequency of Social Gatherings with Friends and Family: Patient declined    Attends Religious Services: More than 4 times per year    Active Member of [employer] or Organizations: No    Attends Engineer, structural: Not on file    Marital Status: Never married  Intimate Partner Violence: Not At Risk (08/19/2023)   Received from [facility]   Humiliation, Afraid, Rape, and Kick questionnaire    Fear of Current or Ex-Partner: No    Emotionally Abused: No    Physically Abused: No    Sexually Abused: No    Family History  Problem Relation Age of Onset   Hypertension Mother    Healthy Father     History reviewed. No pertinent surgical history.  ROS: Review of Systems Negative except as stated above  PHYSICAL EXAM: BP 113/78   Pulse 90   Temp 98.7 F (37.1 C) (Oral)   Resp 18   Ht 5\' 7"  (1.702 m)   Wt (!) 394 lb 3.2 oz (178.8 kg)   LMP  (LMP Unknown)   SpO2 96%   BMI 61.74 kg/m   Wt Readings from Last 3 Encounters:  10/31/23 (!) 394 lb 3.2 oz (178.8 kg)  09/28/23 (!) 394 lb 12.8 oz (179.1 kg)  08/25/23 (!) 396 lb (179.6 kg)   Physical Exam HENT:     Head: Normocephalic and atraumatic.     Nose: Nose normal.     Mouth/Throat:     Mouth: Mucous membranes are moist.     Pharynx: Oropharynx is clear.  Eyes:     Extraocular Movements: Extraocular movements intact.     Conjunctiva/sclera: Conjunctivae normal.     Pupils: Pupils are equal, round, and reactive to light.  Cardiovascular:     Rate and Rhythm: Normal rate and regular rhythm.     Pulses: Normal pulses.     Heart sounds: Normal heart sounds.  Pulmonary:     Effort: Pulmonary effort is normal.     Breath sounds: Normal breath sounds.  Musculoskeletal:        General: Normal range of motion.     Cervical back: Normal range of motion and neck supple.  Neurological:     General: No  focal deficit present.     Mental Status: She is alert and oriented to person, place, and time.  Psychiatric:        Mood and Affect: Mood normal.        Behavior: Behavior normal.     ASSESSMENT AND PLAN: 1. Encounter for weight management (Primary) 2. BMI 60.0-69.9, adult (HCC) - Continue Semaglutide -Weight Management as prescribed. Counseled on medication adherence/adverse effects.  - Follow-up with primary provider in 4 weeks or sooner if needed. -  Semaglutide -Weight Management (WEGOVY ) 2.4 MG/0.75ML SOAJ; Inject 2.4 mg into the skin once a week.  Dispense: 3 mL; Refill: 0    Patient was given the opportunity to ask questions.  Patient verbalized understanding of the plan and was able to repeat key elements of the plan. Patient was given clear instructions to go to Emergency Department or return to medical center if symptoms don't improve, worsen, or new problems develop.The patient verbalized understanding.   Requested Prescriptions   Signed Prescriptions Disp Refills   Semaglutide -Weight Management (WEGOVY ) 2.4 MG/0.75ML SOAJ 3 mL 0    Sig: Inject 2.4 mg into the skin once a week.    Return in 4 weeks (on 11/28/2023) for Follow-Up or next available weight check with Abraham Abo, MD.  Senaida Dama, NP

## 2023-11-17 DIAGNOSIS — F411 Generalized anxiety disorder: Secondary | ICD-10-CM | POA: Diagnosis not present

## 2023-11-17 DIAGNOSIS — R41840 Attention and concentration deficit: Secondary | ICD-10-CM | POA: Diagnosis not present

## 2023-12-07 ENCOUNTER — Ambulatory Visit
Admission: RE | Admit: 2023-12-07 | Discharge: 2023-12-07 | Disposition: A | Discharge status: Home / Self Care | Source: Ambulatory Visit | Attending: Family Medicine | Admitting: Family Medicine

## 2023-12-07 VITALS — BP 117/84 | HR 86 | Temp 98.5°F | Resp 14

## 2023-12-07 DIAGNOSIS — R197 Diarrhea, unspecified: Secondary | ICD-10-CM | POA: Diagnosis not present

## 2023-12-07 DIAGNOSIS — R110 Nausea: Secondary | ICD-10-CM | POA: Diagnosis not present

## 2023-12-07 LAB — POCT URINALYSIS DIP (MANUAL ENTRY)
Bilirubin, UA: NEGATIVE
Blood, UA: NEGATIVE
Glucose, UA: NEGATIVE mg/dL
Ketones, POC UA: NEGATIVE mg/dL
Leukocytes, UA: NEGATIVE
Nitrite, UA: NEGATIVE
Protein Ur, POC: NEGATIVE mg/dL
Spec Grav, UA: 1.025 (ref 1.010–1.025)
Urobilinogen, UA: 0.2 U/dL
pH, UA: 6 (ref 5.0–8.0)

## 2023-12-07 LAB — POCT URINE PREGNANCY: Preg Test, Ur: NEGATIVE

## 2023-12-07 MED ORDER — ONDANSETRON 4 MG PO TBDP
4.0000 mg | ORAL_TABLET | Freq: Three times a day (TID) | ORAL | 0 refills | Status: DC | PRN
Start: 2023-12-07 — End: 2024-02-07

## 2023-12-07 MED ORDER — ONDANSETRON 4 MG PO TBDP
4.0000 mg | ORAL_TABLET | Freq: Once | ORAL | Status: AC
  Administered 2023-12-07: 4 mg via ORAL

## 2023-12-07 MED ORDER — PANTOPRAZOLE SODIUM 40 MG PO TBEC: 40.0000 mg | DELAYED_RELEASE_TABLET | Freq: Every day | ORAL | 2 refills | Status: DC

## 2023-12-07 NOTE — ED Triage Notes (Addendum)
 Pt reports sulphur burps, stomach acid, gas, nausea, vomiting, diarrhea, and centralized abdominal pain x1 week. Pt is currently taking wegovy  injections - has been on this since Dec '24. Previously has not caused any side effects like this, but pt is unsure what else could be causing symptoms. Had little relief with pepto and gas-x at home. 6 loose stools in last 24hrs. No emesis episodes in last 48hrs. Pt is pushing fluids and keeping them down. Actively nauseous currently. Pt reports negative pregnancy test on 12/05/23.

## 2023-12-07 NOTE — Discharge Instructions (Addendum)
 You have been seen today for abdominal pain. Your evaluation was not suggestive of any emergent condition requiring medical intervention at this time. However, some abdominal problems make take more time to appear. Therefore, it is very important for you to pay attention to any new symptoms or worsening of your current condition.  Please return here or to the Emergency Department immediately should you begin to feel worse in any way or have any of the following symptoms: increasing or different abdominal pain, persistent vomiting, inability to drink fluids, fevers, or shaking chills.

## 2023-12-08 NOTE — ED Provider Notes (Signed)
 [facility]   [id number] 12/07/23 Arrival Time: 1743  ASSESSMENT & PLAN:  1. Nausea without vomiting   2. Diarrhea, unspecified type    Likely mild gastroenteritis +/- GERD. Discussed. Benign abd exam. Declines blood work.  Meds ordered this encounter  Medications   ondansetron (ZOFRAN-ODT) disintegrating tablet 4 mg   ondansetron (ZOFRAN-ODT) 4 MG disintegrating tablet    Sig: Take 1 tablet (4 mg total) by mouth every 8 (eight) hours as needed for nausea or vomiting.    Dispense:  15 tablet    Refill:  0   pantoprazole  (PROTONIX ) 40 MG tablet    Sig: Take 1 tablet (40 mg total) by mouth daily.    Dispense:  30 tablet    Refill:  2     Discharge Instructions      You have been seen today for abdominal pain. Your evaluation was not suggestive of any emergent condition requiring medical intervention at this time. However, some abdominal problems make take more time to appear. Therefore, it is very important for you to pay attention to any new symptoms or worsening of your current condition.  Please return here or to the Emergency Department immediately should you begin to feel worse in any way or have any of the following symptoms: increasing or different abdominal pain, persistent vomiting, inability to drink fluids, fevers, or shaking chills.      Reviewed expectations re: course of current medical issues. Questions answered. Outlined signs and symptoms indicating need for more acute intervention. Patient verbalized understanding. After Visit Summary given.   SUBJECTIVE: History from: patient.  Danielle [name] is a 38 y.o. female who presents with complaint of Pt reports freq belching and stomach acid, gas, nausea, vomiting, diarrhea, and centralized abdominal discofmort; grad onset;  x1 week. Pt is currently taking wegovy  injections - has been on this since Dec '24. Previously has not caused any side effects like this, but pt is unsure what else could be  causing symptoms. Had little relief with pepto and gas-x at home. 6 loose stools in last 24hrs. No emesis episodes in last 48hrs. Pt is pushing fluids and keeping them down. Actively nauseous currently. Pt reports negative pregnancy test on 12/05/23.   No LMP recorded. (Menstrual status: IUD).  History reviewed. No pertinent surgical history.   OBJECTIVE:  Vitals:   12/07/23 1808  BP: 117/84  Pulse: 86  Resp: 14  Temp: 98.5 F (36.9 C)  TempSrc: Oral  SpO2: 98%    General appearance: alert; no distress Oropharynx: moist Lungs: clear to auscultation bilaterally; unlabored Heart: regular rate and rhythm Abdomen: soft; non-distended; no significant abdominal tenderness; reports "cramping" feeling; bowel sounds present; no masses or organomegaly; no guarding or rebound tenderness Back: no CVA tenderness Extremities: no edema; symmetrical with no gross deformities Skin: warm; dry Neurologic: normal gait Psychological: alert and cooperative; normal mood and affect  Labs: Results for orders placed or performed during the hospital encounter of 12/07/23  POCT urinalysis dipstick   Collection Time: 12/07/23  6:29 PM  Result Value Ref Range   Color, UA yellow yellow   Clarity, UA clear clear   Glucose, UA negative negative mg/dL   Bilirubin, UA negative negative   Ketones, POC UA negative negative mg/dL   Spec Grav, UA 4.098 1.191 - 1.025   Blood, UA negative negative   pH, UA 6.0 5.0 - 8.0   Protein Ur, POC negative negative mg/dL   Urobilinogen, UA 0.2 0.2 or 1.0 E.U./dL  Nitrite, UA Negative Negative   Leukocytes, UA Negative Negative  POCT urine pregnancy   Collection Time: 12/07/23  6:29 PM  Result Value Ref Range   Preg Test, Ur Negative    Labs Reviewed  POCT URINE PREGNANCY - Normal  POCT URINALYSIS DIP (MANUAL ENTRY)    Imaging: No results found.  Allergies  Allergen Reactions   Sulfa Antibiotics Rash and Dermatitis                                                Past Medical History:  Diagnosis Date   Allergy    Asthma    Social History   Socioeconomic History   Marital status: Single    Spouse name: Not on file   Number of children: Not on file   Years of education: Not on file   Highest education level: Master's degree (e.g., MA, MS, MEng, MEd, MSW, MBA)  Occupational History   Not on file  Tobacco Use   Smoking status: Former    Current packs/day: 0.00    Average packs/day: 0.1 packs/day for 5.0 years (0.5 ttl pk-yrs)    Types: Cigarettes    Start date: 07/05/2002    Quit date: 07/06/2007    Years since quitting: 16.4   Smokeless tobacco: Never  Vaping Use   Vaping status: Some Days   Start date: 07/05/2021  Substance and Sexual Activity   Alcohol use: Yes    Comment: Occasionally   Drug use: No   Sexual activity: Yes    Birth control/protection: I.U.D.  Other Topics Concern   Not on file  Social History Narrative   Not on file   Social Drivers of Health   Financial Resource Strain: Low Risk  (07/10/2023)   Overall Financial Resource Strain (CARDIA)    Difficulty of Paying Living Expenses: Not hard at all  Food Insecurity: No Food Insecurity (07/10/2023)   Hunger Vital Sign    Worried About Running Out of Food in the Last Year: Never true    Ran Out of Food in the Last Year: Never true  Transportation Needs: No Transportation Needs (07/10/2023)   PRAPARE - Administrator, Civil Service (Medical): No    Lack of Transportation (Non-Medical): No  Physical Activity: Insufficiently Active (08/19/2023)   Received from [facility]   Exercise Vital Sign    Days of Exercise per Week: 1 day    Minutes of Exercise per Session: 30 min  Stress: No Stress Concern Present (07/10/2023)   [employer] of Occupational Health - Occupational Stress Questionnaire    Feeling of Stress : Only a little  Social Connections: Moderately Isolated (07/10/2023)   Social Connection and Isolation Panel [NHANES]    Frequency of  Communication with Friends and Family: Three times a week    Frequency of Social Gatherings with Friends and Family: Patient declined    Attends Religious Services: More than 4 times per year    Active Member of [employer] or Organizations: No    Attends Engineer, structural: Not on file    Marital Status: Never married  Intimate Partner Violence: Not At Risk (08/19/2023)   Received from [facility]   Humiliation, Afraid, Rape, and Kick questionnaire    Fear of Current or Ex-Partner: No    Emotionally Abused: No    Physically  Abused: No    Sexually Abused: No   Family History  Problem Relation Age of Onset   Hypertension Mother    Healthy Father       Afton Albright, MD 12/08/23 [phone]

## 2023-12-22 DIAGNOSIS — R41840 Attention and concentration deficit: Secondary | ICD-10-CM | POA: Diagnosis not present

## 2023-12-22 DIAGNOSIS — F411 Generalized anxiety disorder: Secondary | ICD-10-CM | POA: Diagnosis not present

## 2024-01-02 ENCOUNTER — Ambulatory Visit (INDEPENDENT_AMBULATORY_CARE_PROVIDER_SITE_OTHER): Admitting: Family Medicine

## 2024-01-02 ENCOUNTER — Encounter: Payer: Self-pay | Admitting: Family Medicine

## 2024-01-02 VITALS — BP 111/81 | HR 96 | Wt 395.0 lb

## 2024-01-02 DIAGNOSIS — Z6841 Body Mass Index (BMI) 40.0 and over, adult: Secondary | ICD-10-CM

## 2024-01-02 DIAGNOSIS — J4540 Moderate persistent asthma, uncomplicated: Secondary | ICD-10-CM

## 2024-01-02 DIAGNOSIS — Z7689 Persons encountering health services in other specified circumstances: Secondary | ICD-10-CM

## 2024-01-02 MED ORDER — ALBUTEROL SULFATE HFA 108 (90 BASE) MCG/ACT IN AERS: 2.0000 | INHALATION_SPRAY | Freq: Four times a day (QID) | RESPIRATORY_TRACT | 2 refills | Status: DC | PRN

## 2024-01-02 MED ORDER — WEGOVY 0.25 MG/0.5ML ~~LOC~~ SOAJ: 0.2500 mg | SUBCUTANEOUS | 0 refills | Status: DC

## 2024-01-02 NOTE — Progress Notes (Unsigned)
 Established Patient Office Visit  Subjective    Patient ID: Danielle [name], female    DOB: 01-07-1986  Age: 38 y.o. MRN: [id number]  CC:  Chief Complaint  Patient presents with   Medical Management of Chronic Issues    HPI Danielle [name] presents for weight management. Patient reports that she had UC visit 2/2 SE of wegovy . She would like to decrease to the starting dose and restart meds as she does not want to regain weight. She had been on the highest dose.   Outpatient Encounter Medications as of 01/02/2024  Medication Sig   buPROPion (WELLBUTRIN XL) 150 MG 24 hr tablet 150 mg.   cetirizine (ZYRTEC) 10 MG tablet    pantoprazole  (PROTONIX ) 40 MG tablet Take 1 tablet (40 mg total) by mouth daily.   Semaglutide -Weight Management (WEGOVY ) 0.25 MG/0.5ML SOAJ Inject 0.25 mg into the skin once a week.   Semaglutide -Weight Management (WEGOVY ) 2.4 MG/0.75ML SOAJ Inject 2.4 mg into the skin once a week.   traZODone (DESYREL) 50 MG tablet    [DISCONTINUED] albuterol  (PROAIR  HFA) 108 (90 Base) MCG/ACT inhaler Inhale 2 puffs every 4 hours by inhalation route.   albuterol  (PROAIR  HFA) 108 (90 Base) MCG/ACT inhaler Inhale 2 puffs into the lungs every 6 (six) hours as needed for wheezing or shortness of breath.   fluticasone (FLONASE) 50 MCG/ACT nasal spray 2 sprays into each nostril daily.   liothyronine (CYTOMEL) 5 MCG tablet    ondansetron  (ZOFRAN -ODT) 4 MG disintegrating tablet Take 1 tablet (4 mg total) by mouth every 8 (eight) hours as needed for nausea or vomiting.   [DISCONTINUED] famotidine (PEPCID) 10 MG tablet Take 10 mg by mouth 2 (two) times daily.   No facility-administered encounter medications on file as of 01/02/2024.    Past Medical History:  Diagnosis Date   Allergy    Asthma     No past surgical history on file.  Family History  Problem Relation Age of Onset   Hypertension Mother    Healthy Father     Social History   Socioeconomic History   Marital status:  Single    Spouse name: Not on file   Number of children: Not on file   Years of education: Not on file   Highest education level: Master's degree (e.g., MA, MS, MEng, MEd, MSW, MBA)  Occupational History   Not on file  Tobacco Use   Smoking status: Former    Current packs/day: 0.00    Average packs/day: 0.1 packs/day for 5.0 years (0.5 ttl pk-yrs)    Types: Cigarettes    Start date: 07/05/2002    Quit date: 07/06/2007    Years since quitting: 16.5   Smokeless tobacco: Never  Vaping Use   Vaping status: Some Days   Start date: 07/05/2021  Substance and Sexual Activity   Alcohol use: Yes    Comment: Occasionally   Drug use: No   Sexual activity: Yes    Birth control/protection: I.U.D.  Other Topics Concern   Not on file  Social History Narrative   Not on file   Social Drivers of Health   Financial Resource Strain: Low Risk  (12/29/2023)   Overall Financial Resource Strain (CARDIA)    Difficulty of Paying Living Expenses: Not hard at all  Food Insecurity: No Food Insecurity (12/29/2023)   Hunger Vital Sign    Worried About Running Out of Food in the Last Year: Never true    Ran Out of Food in the  Last Year: Never true  Transportation Needs: No Transportation Needs (12/29/2023)   PRAPARE - Administrator, Civil Service (Medical): No    Lack of Transportation (Non-Medical): No  Physical Activity: Insufficiently Active (12/29/2023)   Exercise Vital Sign    Days of Exercise per Week: 2 days    Minutes of Exercise per Session: 10 min  Stress: No Stress Concern Present (12/29/2023)   [employer] of Occupational Health - Occupational Stress Questionnaire    Feeling of Stress: Not at all  Social Connections: Moderately Isolated (12/29/2023)   Social Connection and Isolation Panel    Frequency of Communication with Friends and Family: More than three times a week    Frequency of Social Gatherings with Friends and Family: Patient declined    Attends Religious Services:  More than 4 times per year    Active Member of [employer] or Organizations: No    Attends Engineer, structural: Not on file    Marital Status: Never married  Intimate Partner Violence: Not At Risk (08/19/2023)   Received from [facility]   Humiliation, Afraid, [name], and Kick questionnaire    Within the last year, have you been afraid of your partner or ex-partner?: No    Within the last year, have you been humiliated or emotionally abused in other ways by your partner or ex-partner?: No    Within the last year, have you been kicked, hit, slapped, or otherwise physically hurt by your partner or ex-partner?: No    Within the last year, have you been raped or forced to have any kind of sexual activity by your partner or ex-partner?: No    Review of Systems  All other systems reviewed and are negative.       Objective    BP 111/81 (BP Location: Right Wrist, Cuff Size: Large)   Pulse 96   Wt (!) 395 lb (179.2 kg)   SpO2 96%   BMI 61.87 kg/m   Physical Exam Vitals and nursing note reviewed.  Constitutional:      General: She is not in acute distress.    Appearance: She is obese.   Cardiovascular:     Rate and Rhythm: Normal rate and regular rhythm.  Pulmonary:     Effort: Pulmonary effort is normal.     Breath sounds: Normal breath sounds.  Abdominal:     Palpations: Abdomen is soft.     Tenderness: There is no abdominal tenderness.   Neurological:     General: No focal deficit present.     Mental Status: She is alert and oriented to person, place, and time.         Assessment & Plan:   Encounter for weight management  BMI 60.0-69.9, adult (HCC)  Moderate persistent asthma without complication  Other orders -     Wegovy ; Inject 0.25 mg into the skin once a week.  Dispense: 2 mL; Refill: 0 -     Albuterol  Sulfate HFA; Inhale 2 puffs into the lungs every 6 (six) hours as needed for wheezing or shortness of breath.  Dispense: 8 g; Refill: 2     Return  in about 4 weeks (around 01/30/2024) for follow up.   Tanda Raguel SQUIBB, MD

## 2024-01-03 ENCOUNTER — Encounter: Payer: Self-pay | Admitting: Family Medicine

## 2024-02-02 ENCOUNTER — Ambulatory Visit: Admitting: Family Medicine

## 2024-02-06 DIAGNOSIS — Z23 Encounter for immunization: Secondary | ICD-10-CM | POA: Diagnosis not present

## 2024-02-07 ENCOUNTER — Encounter: Payer: Self-pay | Admitting: Family Medicine

## 2024-02-07 ENCOUNTER — Ambulatory Visit (INDEPENDENT_AMBULATORY_CARE_PROVIDER_SITE_OTHER): Admitting: Family Medicine

## 2024-02-07 VITALS — BP 134/90 | HR 79 | Ht 67.0 in | Wt 398.0 lb

## 2024-02-07 DIAGNOSIS — Z7985 Long-term (current) use of injectable non-insulin antidiabetic drugs: Secondary | ICD-10-CM | POA: Diagnosis not present

## 2024-02-07 DIAGNOSIS — Z6841 Body Mass Index (BMI) 40.0 and over, adult: Secondary | ICD-10-CM

## 2024-02-07 DIAGNOSIS — Z7689 Persons encountering health services in other specified circumstances: Secondary | ICD-10-CM

## 2024-02-07 MED ORDER — WEGOVY 0.5 MG/0.5ML ~~LOC~~ SOAJ: 0.5000 mg | SUBCUTANEOUS | 0 refills | Status: DC

## 2024-02-07 NOTE — Progress Notes (Signed)
 Established Patient Office Visit  Subjective    Patient ID: Danielle [name], female    DOB: September 23, 1985  Age: 38 y.o. MRN: [id number]  CC:  Chief Complaint  Patient presents with   Medical Management of Chronic Issues    HPI Danielle [name] presents for routine annual exam. Patient reports increased stressors including the unexpected passing of her good friend.She has not been following her diet as per usual.   Outpatient Encounter Medications as of 02/07/2024  Medication Sig   albuterol  (PROAIR  HFA) 108 (90 Base) MCG/ACT inhaler Inhale 2 puffs into the lungs every 6 (six) hours as needed for wheezing or shortness of breath.   buPROPion (WELLBUTRIN XL) 150 MG 24 hr tablet 150 mg.   cetirizine (ZYRTEC) 10 MG tablet    fluticasone (FLONASE) 50 MCG/ACT nasal spray 2 sprays into each nostril daily.   pantoprazole  (PROTONIX ) 40 MG tablet Take 1 tablet (40 mg total) by mouth daily.   Semaglutide -Weight Management (WEGOVY ) 0.25 MG/0.5ML SOAJ Inject 0.25 mg into the skin once a week.   Semaglutide -Weight Management (WEGOVY ) 0.5 MG/0.5ML SOAJ Inject 0.5 mg into the skin once a week.   traZODone (DESYREL) 50 MG tablet    [DISCONTINUED] famotidine (PEPCID) 10 MG tablet Take 10 mg by mouth 2 (two) times daily.   [DISCONTINUED] liothyronine (CYTOMEL) 5 MCG tablet    [DISCONTINUED] ondansetron  (ZOFRAN -ODT) 4 MG disintegrating tablet Take 1 tablet (4 mg total) by mouth every 8 (eight) hours as needed for nausea or vomiting.   [DISCONTINUED] Semaglutide -Weight Management (WEGOVY ) 2.4 MG/0.75ML SOAJ Inject 2.4 mg into the skin once a week.   No facility-administered encounter medications on file as of 02/07/2024.    Past Medical History:  Diagnosis Date   Allergy    Asthma     History reviewed. No pertinent surgical history.  Family History  Problem Relation Age of Onset   Hypertension Mother    Healthy Father     Social History   Socioeconomic History   Marital status: Single     Spouse name: Not on file   Number of children: Not on file   Years of education: Not on file   Highest education level: Master's degree (e.g., MA, MS, MEng, MEd, MSW, MBA)  Occupational History   Not on file  Tobacco Use   Smoking status: Former    Current packs/day: 0.00    Average packs/day: 0.1 packs/day for 5.0 years (0.5 ttl pk-yrs)    Types: Cigarettes    Start date: 07/05/2002    Quit date: 07/06/2007    Years since quitting: 16.6   Smokeless tobacco: Never  Vaping Use   Vaping status: Some Days   Start date: 07/05/2021  Substance and Sexual Activity   Alcohol use: Yes    Comment: Occasionally   Drug use: No   Sexual activity: Yes    Birth control/protection: I.U.D.  Other Topics Concern   Not on file  Social History Narrative   Not on file   Social Drivers of Health   Financial Resource Strain: Low Risk  (12/29/2023)   Overall Financial Resource Strain (CARDIA)    Difficulty of Paying Living Expenses: Not hard at all  Food Insecurity: No Food Insecurity (12/29/2023)   Hunger Vital Sign    Worried About Running Out of Food in the Last Year: Never true    Ran Out of Food in the Last Year: Never true  Transportation Needs: No Transportation Needs (12/29/2023)   PRAPARE - Transportation  Lack of Transportation (Medical): No    Lack of Transportation (Non-Medical): No  Physical Activity: Insufficiently Active (12/29/2023)   Exercise Vital Sign    Days of Exercise per Week: 2 days    Minutes of Exercise per Session: 10 min  Stress: No Stress Concern Present (12/29/2023)   [employer] of Occupational Health - Occupational Stress Questionnaire    Feeling of Stress: Not at all  Social Connections: Moderately Isolated (12/29/2023)   Social Connection and Isolation Panel    Frequency of Communication with Friends and Family: More than three times a week    Frequency of Social Gatherings with Friends and Family: Patient declined    Attends Religious Services: More than 4  times per year    Active Member of [employer] or Organizations: No    Attends Engineer, structural: Not on file    Marital Status: Never married  Intimate Partner Violence: Not At Risk (08/19/2023)   Received from [facility]   Humiliation, Afraid, Rape, and Kick questionnaire    Within the last year, have you been afraid of your partner or ex-partner?: No    Within the last year, have you been humiliated or emotionally abused in other ways by your partner or ex-partner?: No    Within the last year, have you been kicked, hit, slapped, or otherwise physically hurt by your partner or ex-partner?: No    Within the last year, have you been raped or forced to have any kind of sexual activity by your partner or ex-partner?: No    Review of Systems  All other systems reviewed and are negative.       Objective    BP (!) 134/90   Pulse 79   Ht 5' 7 (1.702 m)   Wt (!) 398 lb (180.5 kg)   LMP  (Exact Date)   SpO2 99%   BMI 62.34 kg/m   Physical Exam Vitals and nursing note reviewed.  Constitutional:      General: She is not in acute distress.    Appearance: She is obese.  Cardiovascular:     Rate and Rhythm: Normal rate and regular rhythm.  Pulmonary:     Effort: Pulmonary effort is normal.     Breath sounds: Normal breath sounds.  Abdominal:     Palpations: Abdomen is soft.     Tenderness: There is no abdominal tenderness.  Neurological:     General: No focal deficit present.     Mental Status: She is alert and oriented to person, place, and time.         Assessment & Plan:   Encounter for weight management  BMI 60.0-69.9, adult (HCC)  Other orders -     Wegovy ; Inject 0.5 mg into the skin once a week.  Dispense: 2 mL; Refill: 0     No follow-ups on file.   Tanda Raguel SQUIBB, MD

## 2024-02-16 DIAGNOSIS — R41840 Attention and concentration deficit: Secondary | ICD-10-CM | POA: Diagnosis not present

## 2024-02-16 DIAGNOSIS — F411 Generalized anxiety disorder: Secondary | ICD-10-CM | POA: Diagnosis not present

## 2024-03-07 ENCOUNTER — Other Ambulatory Visit: Payer: Self-pay | Admitting: Family Medicine

## 2024-03-07 ENCOUNTER — Ambulatory Visit: Admitting: Family Medicine

## 2024-03-20 ENCOUNTER — Ambulatory Visit (INDEPENDENT_AMBULATORY_CARE_PROVIDER_SITE_OTHER): Admitting: Family Medicine

## 2024-03-20 ENCOUNTER — Encounter: Payer: Self-pay | Admitting: Family Medicine

## 2024-03-20 VITALS — BP 155/88 | HR 79 | Ht 67.0 in | Wt 397.2 lb

## 2024-03-20 DIAGNOSIS — E669 Obesity, unspecified: Secondary | ICD-10-CM | POA: Diagnosis not present

## 2024-03-20 DIAGNOSIS — Z6841 Body Mass Index (BMI) 40.0 and over, adult: Secondary | ICD-10-CM

## 2024-03-20 DIAGNOSIS — Z7689 Persons encountering health services in other specified circumstances: Secondary | ICD-10-CM

## 2024-03-20 DIAGNOSIS — Z87891 Personal history of nicotine dependence: Secondary | ICD-10-CM | POA: Diagnosis not present

## 2024-03-20 DIAGNOSIS — Z7985 Long-term (current) use of injectable non-insulin antidiabetic drugs: Secondary | ICD-10-CM

## 2024-03-21 ENCOUNTER — Encounter: Payer: Self-pay | Admitting: Family Medicine

## 2024-03-21 NOTE — Progress Notes (Addendum)
 Established Patient Office Visit  Subjective    Patient ID: Danielle [name], female    DOB: 28-Nov-1985  Age: 38 y.o. MRN: [id number]  CC:  Chief Complaint  Patient presents with   Medical Management of Chronic Issues    4 week follow up     HPI Danielle [name] presents fir routine med management. Patient reports that she had been grieving but is now back on track.   Outpatient Encounter Medications as of 03/20/2024  Medication Sig   albuterol  (PROAIR  HFA) 108 (90 Base) MCG/ACT inhaler Inhale 2 puffs into the lungs every 6 (six) hours as needed for wheezing or shortness of breath.   cetirizine (ZYRTEC) 10 MG tablet    fluticasone (FLONASE) 50 MCG/ACT nasal spray 2 sprays into each nostril daily.   semaglutide -weight management (WEGOVY ) 0.5 MG/0.5ML SOAJ SQ injection INJECT 0.5 MG INTO THE SKIN ONE TIME PER WEEK   traZODone (DESYREL) 50 MG tablet    buPROPion (WELLBUTRIN XL) 150 MG 24 hr tablet 150 mg.   pantoprazole  (PROTONIX ) 40 MG tablet Take 1 tablet (40 mg total) by mouth daily. (Patient not taking: Reported on 03/20/2024)   Semaglutide -Weight Management (WEGOVY ) 0.25 MG/0.5ML SOAJ Inject 0.25 mg into the skin once a week.   [DISCONTINUED] famotidine (PEPCID) 10 MG tablet Take 10 mg by mouth 2 (two) times daily.   No facility-administered encounter medications on file as of 03/20/2024.    Past Medical History:  Diagnosis Date   Allergy    Asthma     History reviewed. No pertinent surgical history.  Family History  Problem Relation Age of Onset   Hypertension Mother    Healthy Father     Social History   Socioeconomic History   Marital status: Single    Spouse name: Not on file   Number of children: Not on file   Years of education: Not on file   Highest education level: Master's degree (e.g., MA, MS, MEng, MEd, MSW, MBA)  Occupational History   Not on file  Tobacco Use   Smoking status: Former    Current packs/day: 0.00    Average packs/day: 0.1 packs/day  for 5.0 years (0.5 ttl pk-yrs)    Types: Cigarettes    Start date: 07/05/2002    Quit date: 07/06/2007    Years since quitting: 16.7   Smokeless tobacco: Never  Vaping Use   Vaping status: Some Days   Start date: 07/05/2021  Substance and Sexual Activity   Alcohol use: Yes    Comment: Occasionally   Drug use: No   Sexual activity: Yes    Birth control/protection: I.U.D.  Other Topics Concern   Not on file  Social History Narrative   Not on file   Social Drivers of Health   Financial Resource Strain: Low Risk  (12/29/2023)   Overall Financial Resource Strain (CARDIA)    Difficulty of Paying Living Expenses: Not hard at all  Food Insecurity: No Food Insecurity (12/29/2023)   Hunger Vital Sign    Worried About Running Out of Food in the Last Year: Never true    Ran Out of Food in the Last Year: Never true  Transportation Needs: No Transportation Needs (12/29/2023)   PRAPARE - Administrator, Civil Service (Medical): No    Lack of Transportation (Non-Medical): No  Physical Activity: Insufficiently Active (12/29/2023)   Exercise Vital Sign    Days of Exercise per Week: 2 days    Minutes of Exercise per Session:  10 min  Stress: No Stress Concern Present (12/29/2023)   [employer] of Occupational Health - Occupational Stress Questionnaire    Feeling of Stress: Not at all  Social Connections: Moderately Isolated (12/29/2023)   Social Connection and Isolation Panel    Frequency of Communication with Friends and Family: More than three times a week    Frequency of Social Gatherings with Friends and Family: Patient declined    Attends Religious Services: More than 4 times per year    Active Member of [employer] or Organizations: No    Attends Engineer, structural: Not on file    Marital Status: Never married  Intimate Partner Violence: Not At Risk (08/19/2023)   Received from [facility]   Humiliation, Afraid, Rape, and Kick questionnaire    Within the last year,  have you been afraid of your partner or ex-partner?: No    Within the last year, have you been humiliated or emotionally abused in other ways by your partner or ex-partner?: No    Within the last year, have you been kicked, hit, slapped, or otherwise physically hurt by your partner or ex-partner?: No    Within the last year, have you been raped or forced to have any kind of sexual activity by your partner or ex-partner?: No    Review of Systems  All other systems reviewed and are negative.       Objective    BP (!) 155/88   Pulse 79   Ht 5' 7 (1.702 m)   Wt (!) 397 lb 3.2 oz (180.2 kg)   SpO2 96%   BMI 62.21 kg/m   Physical Exam Vitals and nursing note reviewed.  Constitutional:      General: She is not in acute distress.    Appearance: She is obese.  Cardiovascular:     Rate and Rhythm: Normal rate and regular rhythm.  Pulmonary:     Effort: Pulmonary effort is normal.     Breath sounds: Normal breath sounds.  Abdominal:     Palpations: Abdomen is soft.     Tenderness: There is no abdominal tenderness.  Neurological:     General: No focal deficit present.     Mental Status: She is alert and oriented to person, place, and time.         Assessment & Plan:   Encounter for weight management  BMI 60.0-69.9, adult (HCC)  Will increase dosage with next renewal.   The patient will adhere to a reduced calorie diet of   2400   calories per day. The patient will continue lifestyle modifications including diet and  3  minutes of exercise per week.    Return in about 4 weeks (around 04/17/2024) for follow up, chronic med issues.   Tanda Raguel SQUIBB, MD

## 2024-04-10 DIAGNOSIS — F5105 Insomnia due to other mental disorder: Secondary | ICD-10-CM | POA: Diagnosis not present

## 2024-04-10 DIAGNOSIS — F321 Major depressive disorder, single episode, moderate: Secondary | ICD-10-CM | POA: Diagnosis not present

## 2024-04-10 DIAGNOSIS — F411 Generalized anxiety disorder: Secondary | ICD-10-CM | POA: Diagnosis not present

## 2024-04-17 ENCOUNTER — Other Ambulatory Visit: Payer: Self-pay | Admitting: Family Medicine

## 2024-05-06 ENCOUNTER — Ambulatory Visit

## 2024-05-08 ENCOUNTER — Ambulatory Visit (INDEPENDENT_AMBULATORY_CARE_PROVIDER_SITE_OTHER): Admitting: Family Medicine

## 2024-05-08 VITALS — BP 155/89 | HR 81 | Ht 67.0 in | Wt 391.6 lb

## 2024-05-08 DIAGNOSIS — F17290 Nicotine dependence, other tobacco product, uncomplicated: Secondary | ICD-10-CM

## 2024-05-08 DIAGNOSIS — R399 Unspecified symptoms and signs involving the genitourinary system: Secondary | ICD-10-CM | POA: Diagnosis not present

## 2024-05-08 DIAGNOSIS — J4520 Mild intermittent asthma, uncomplicated: Secondary | ICD-10-CM

## 2024-05-08 DIAGNOSIS — E66813 Obesity, class 3: Secondary | ICD-10-CM | POA: Diagnosis not present

## 2024-05-08 DIAGNOSIS — Z6841 Body Mass Index (BMI) 40.0 and over, adult: Secondary | ICD-10-CM | POA: Diagnosis not present

## 2024-05-08 DIAGNOSIS — Z7689 Persons encountering health services in other specified circumstances: Secondary | ICD-10-CM

## 2024-05-08 MED ORDER — NITROFURANTOIN MONOHYD MACRO 100 MG PO CAPS: 100.0000 mg | ORAL_CAPSULE | Freq: Two times a day (BID) | ORAL | 0 refills | Status: DC

## 2024-05-08 MED ORDER — ALBUTEROL SULFATE HFA 108 (90 BASE) MCG/ACT IN AERS: 2.0000 | INHALATION_SPRAY | Freq: Four times a day (QID) | RESPIRATORY_TRACT | 2 refills | Status: AC | PRN

## 2024-05-08 MED ORDER — WEGOVY 1 MG/0.5ML ~~LOC~~ SOAJ: 1.0000 mg | SUBCUTANEOUS | 0 refills | Status: DC

## 2024-05-09 ENCOUNTER — Encounter: Payer: Self-pay | Admitting: Family Medicine

## 2024-05-09 LAB — POCT URINALYSIS DIP (CLINITEK)
Bilirubin, UA: NEGATIVE
Glucose, UA: NEGATIVE mg/dL
Ketones, POC UA: NEGATIVE mg/dL
Nitrite, UA: POSITIVE — AB
Spec Grav, UA: >=1.03 — AB (ref 1.010–1.025)
Urobilinogen, UA: 0.2 U/dL
pH, UA: 6 (ref 5.0–8.0)

## 2024-05-09 NOTE — Progress Notes (Signed)
 Established Patient Office Visit  Subjective    Patient ID: Danielle [name], female    DOB: 05/13/1986  Age: 38 y.o. MRN: [id number]  CC:  Chief Complaint  Patient presents with   Medical Management of Chronic Issues    HPI [name] presents for routine weight management. She reports doing well with present management. She also reports some dysuria.   Outpatient Encounter Medications as of 05/08/2024  Medication Sig   cetirizine (ZYRTEC) 10 MG tablet    fluticasone (FLONASE) 50 MCG/ACT nasal spray 2 sprays into each nostril daily.   nitrofurantoin, macrocrystal-monohydrate, (MACROBID) 100 MG capsule Take 1 capsule (100 mg total) by mouth 2 (two) times daily.   semaglutide -weight management (WEGOVY ) 1 MG/0.5ML SOAJ SQ injection Inject 1 mg into the skin once a week.   traZODone (DESYREL) 50 MG tablet    albuterol  (PROAIR  HFA) 108 (90 Base) MCG/ACT inhaler Inhale 2 puffs into the lungs every 6 (six) hours as needed for wheezing or shortness of breath.   pantoprazole  (PROTONIX ) 40 MG tablet Take 1 tablet (40 mg total) by mouth daily. (Patient not taking: Reported on 03/20/2024)   semaglutide -weight management (WEGOVY ) 0.5 MG/0.5ML SOAJ SQ injection INJECT 0.5 MG INTO THE SKIN ONE TIME PER WEEK   [DISCONTINUED] albuterol  (PROAIR  HFA) 108 (90 Base) MCG/ACT inhaler Inhale 2 puffs into the lungs every 6 (six) hours as needed for wheezing or shortness of breath.   [DISCONTINUED] famotidine (PEPCID) 10 MG tablet Take 10 mg by mouth 2 (two) times daily.   No facility-administered encounter medications on file as of 05/08/2024.    Past Medical History:  Diagnosis Date   Allergy    Asthma     No past surgical history on file.  Family History  Problem Relation Age of Onset   Hypertension Mother    Healthy Father     Social History   Socioeconomic History   Marital status: Single    Spouse name: Not on file   Number of children: Not on file   Years of education: Not on file    Highest education level: Master's degree (e.g., MA, MS, MEng, MEd, MSW, MBA)  Occupational History   Not on file  Tobacco Use   Smoking status: Former    Current packs/day: 0.00    Average packs/day: 0.1 packs/day for 5.0 years (0.5 ttl pk-yrs)    Types: Cigarettes    Start date: 07/05/2002    Quit date: 07/06/2007    Years since quitting: 16.8   Smokeless tobacco: Never  Vaping Use   Vaping status: Some Days   Start date: 07/05/2021  Substance and Sexual Activity   Alcohol use: Yes    Comment: Occasionally   Drug use: No   Sexual activity: Yes    Birth control/protection: I.U.D.  Other Topics Concern   Not on file  Social History Narrative   Not on file   Social Drivers of Health   Financial Resource Strain: Low Risk  (05/07/2024)   Overall Financial Resource Strain (CARDIA)    Difficulty of Paying Living Expenses: Not hard at all  Food Insecurity: No Food Insecurity (05/07/2024)   Hunger Vital Sign    Worried About Running Out of Food in the Last Year: Never true    Ran Out of Food in the Last Year: Never true  Transportation Needs: No Transportation Needs (05/07/2024)   PRAPARE - Administrator, Civil Service (Medical): No    Lack of Transportation (Non-Medical):  No  Physical Activity: Insufficiently Active (05/07/2024)   Exercise Vital Sign    Days of Exercise per Week: 2 days    Minutes of Exercise per Session: 10 min  Stress: No Stress Concern Present (05/07/2024)   [employer] of Occupational Health - Occupational Stress Questionnaire    Feeling of Stress: Only a little  Social Connections: Moderately Isolated (05/07/2024)   Social Connection and Isolation Panel    Frequency of Communication with Friends and Family: More than three times a week    Frequency of Social Gatherings with Friends and Family: Once a week    Attends Religious Services: More than 4 times per year    Active Member of [employer] or Organizations: No    Attends Hospital Doctor: Not on file    Marital Status: Never married  Intimate Partner Violence: Not At Risk (08/19/2023)   Received [facility]   Humiliation, Afraid, Rape, and Kick questionnaire    Within the last year, have you been afraid of your partner or ex-partner?: No    Within the last year, have you been humiliated or emotionally abused in other ways by your partner or ex-partner?: No    Within the last year, have you been kicked, hit, slapped, or otherwise physically hurt by your partner or ex-partner?: No    Within the last year, have you been raped or forced to have any kind of sexual activity by your partner or ex-partner?: No    Review of Systems  Genitourinary:  Positive for dysuria.  All other systems reviewed and are negative.       Objective    BP (!) 155/89   Pulse 81   Ht 5' 7 (1.702 m)   Wt (!) 391 lb 9.6 oz (177.6 kg)   SpO2 95%   BMI 61.33 kg/m   Physical Exam Vitals and nursing note reviewed.  Constitutional:      General: She is not in acute distress. Cardiovascular:     Rate and Rhythm: Normal rate and regular rhythm.  Pulmonary:     Effort: Pulmonary effort is normal.     Breath sounds: Normal breath sounds.  Abdominal:     Palpations: Abdomen is soft.     Tenderness: There is no abdominal tenderness.  Neurological:     General: No focal deficit present.     Mental Status: She is alert and oriented to person, place, and time.         Assessment & Plan:   1. Encounter for weight management (Primary) Doing well with present management. Wegovy  refilled.   2. Class 3 severe obesity due to excess calories with serious comorbidity and body mass index (BMI) of 60.0 to 69.9 in adult (HCC)   3. UTI symptoms Macrobid prescribed.  - POCT URINALYSIS DIP (CLINITEK)  4. Mild intermittent asthma without complication Appears stable. Albuterol  MDI refilled.     No follow-ups on file.   Tanda Raguel SQUIBB, MD

## 2024-05-11 ENCOUNTER — Ambulatory Visit: Payer: Self-pay | Admitting: Family Medicine

## 2024-05-30 ENCOUNTER — Other Ambulatory Visit: Payer: Self-pay | Admitting: Family Medicine

## 2024-06-05 ENCOUNTER — Ambulatory Visit: Admitting: Family Medicine

## 2024-06-05 ENCOUNTER — Encounter: Payer: Self-pay | Admitting: Family Medicine

## 2024-06-05 VITALS — BP 107/84 | Temp 98.5°F | Resp 18 | Ht 67.0 in | Wt 396.0 lb

## 2024-06-05 DIAGNOSIS — Z7689 Persons encountering health services in other specified circumstances: Secondary | ICD-10-CM

## 2024-06-05 DIAGNOSIS — F411 Generalized anxiety disorder: Secondary | ICD-10-CM | POA: Diagnosis not present

## 2024-06-05 DIAGNOSIS — F321 Major depressive disorder, single episode, moderate: Secondary | ICD-10-CM | POA: Diagnosis not present

## 2024-06-05 DIAGNOSIS — Z6841 Body Mass Index (BMI) 40.0 and over, adult: Secondary | ICD-10-CM | POA: Diagnosis not present

## 2024-06-05 DIAGNOSIS — F5105 Insomnia due to other mental disorder: Secondary | ICD-10-CM | POA: Diagnosis not present

## 2024-06-05 DIAGNOSIS — E66813 Obesity, class 3: Secondary | ICD-10-CM | POA: Diagnosis not present

## 2024-06-05 MED ORDER — TIRZEPATIDE-WEIGHT MANAGEMENT 7.5 MG/0.5ML ~~LOC~~ SOLN: 7.5000 mg | SUBCUTANEOUS | 0 refills | Status: DC

## 2024-06-06 ENCOUNTER — Encounter: Payer: Self-pay | Admitting: Family Medicine

## 2024-06-06 NOTE — Progress Notes (Signed)
 Established Patient Office Visit  Subjective    Patient ID: Danielle [name], female    DOB: [birth year]  Age: 38 y.o. MRN: [id number]  CC:  Chief Complaint  Patient presents with   Follow-up    HPI Danielle [name] presents for routine weight management. Patient reports tha tshe did struggle some this month during the holidays. Patient denies acute complaints.   Outpatient Encounter Medications as of 06/05/2024  Medication Sig   tirzepatide  7.5 MG/0.5ML injection vial Inject 7.5 mg into the skin once a week.   albuterol  (PROAIR  HFA) 108 (90 Base) MCG/ACT inhaler Inhale 2 puffs into the lungs every 6 (six) hours as needed for wheezing or shortness of breath.   cetirizine (ZYRTEC) 10 MG tablet    fluticasone (FLONASE) 50 MCG/ACT nasal spray 2 sprays into each nostril daily.   nitrofurantoin , macrocrystal-monohydrate, (MACROBID ) 100 MG capsule Take 1 capsule (100 mg total) by mouth 2 (two) times daily.   pantoprazole  (PROTONIX ) 40 MG tablet Take 1 tablet (40 mg total) by mouth daily. (Patient not taking: Reported on 03/20/2024)   semaglutide -weight management (WEGOVY ) 0.5 MG/0.5ML SOAJ SQ injection INJECT 0.5 MG INTO THE SKIN ONE TIME PER WEEK   semaglutide -weight management (WEGOVY ) 1 MG/0.5ML SOAJ SQ injection Inject 1 mg into the skin once a week.   traZODone (DESYREL) 50 MG tablet    [DISCONTINUED] famotidine (PEPCID) 10 MG tablet Take 10 mg by mouth 2 (two) times daily.   No facility-administered encounter medications on file as of 06/05/2024.    Past Medical History:  Diagnosis Date   Allergy    Asthma     No past surgical history on file.  Family History  Problem Relation Age of Onset   Hypertension Mother    Healthy Father     Social History   Socioeconomic History   Marital status: Single    Spouse name: Not on file   Number of children: Not on file   Years of education: Not on file   Highest education level: Master's degree (e.g., MA, MS, MEng, MEd, MSW, MBA)   Occupational History   Not on file  Tobacco Use   Smoking status: Former    Current packs/day: 0.00    Average packs/day: 0.1 packs/day for 5.0 years (0.5 ttl pk-yrs)    Types: Cigarettes    Start date: 07/05/2002    Quit date: 07/06/2007    Years since quitting: 16.9   Smokeless tobacco: Never  Vaping Use   Vaping status: Some Days   Start date: 07/05/2021  Substance and Sexual Activity   Alcohol use: Yes    Comment: Occasionally   Drug use: No   Sexual activity: Yes    Birth control/protection: I.U.D.  Other Topics Concern   Not on file  Social History Narrative   Not on file   Social Drivers of Health   Financial Resource Strain: Low Risk  (05/07/2024)   Overall Financial Resource Strain (CARDIA)    Difficulty of Paying Living Expenses: Not hard at all  Food Insecurity: No Food Insecurity (05/07/2024)   Hunger Vital Sign    Worried About Running Out of Food in the Last Year: Never true    Ran Out of Food in the Last Year: Never true  Transportation Needs: No Transportation Needs (05/07/2024)   PRAPARE - Administrator, Civil Service (Medical): No    Lack of Transportation (Non-Medical): No  Physical Activity: Insufficiently Active (05/07/2024)   Exercise Vital Sign  Days of Exercise per Week: 2 days    Minutes of Exercise per Session: 10 min  Stress: No Stress Concern Present (05/07/2024)   [employer] of Occupational Health - Occupational Stress Questionnaire    Feeling of Stress: Only a little  Social Connections: Moderately Isolated (05/07/2024)   Social Connection and Isolation Panel    Frequency of Communication with Friends and Family: More than three times a week    Frequency of Social Gatherings with Friends and Family: Once a week    Attends Religious Services: More than 4 times per year    Active Member of [employer] or Organizations: No    Attends Engineer, Structural: Not on file    Marital Status: Never married  Intimate Partner  Violence: Not At Risk (08/19/2023)   Received from [facility]   Humiliation, Afraid, Rape, and Kick questionnaire    Within the last year, have you been afraid of your partner or ex-partner?: No    Within the last year, have you been humiliated or emotionally abused in other ways by your partner or ex-partner?: No    Within the last year, have you been kicked, hit, slapped, or otherwise physically hurt by your partner or ex-partner?: No    Within the last year, have you been raped or forced to have any kind of sexual activity by your partner or ex-partner?: No    Review of Systems  All other systems reviewed and are negative.       Objective    BP 107/84   Temp 98.5 F (36.9 C)   Resp 18   Ht 5' 7 (1.702 m)   Wt (!) 396 lb (179.6 kg)   SpO2 98%   BMI 62.02 kg/m   Physical Exam Vitals and nursing note reviewed.  Constitutional:      General: She is not in acute distress.    Appearance: She is obese.  Cardiovascular:     Rate and Rhythm: Normal rate and regular rhythm.  Pulmonary:     Effort: Pulmonary effort is normal.     Breath sounds: Normal breath sounds.  Abdominal:     Palpations: Abdomen is soft.     Tenderness: There is no abdominal tenderness.  Neurological:     General: No focal deficit present.     Mental Status: She is alert and oriented to person, place, and time.         Assessment & Plan:   1. Encounter for weight management (Primary) Will change from wegovy  to zepbound . Discussed compliance.   2. Class 3 severe obesity due to excess calories with serious comorbidity and body mass index (BMI) of 60.0 to 69.9 in adult [facility])     Return for weight management.   Tanda Raguel SQUIBB, MD

## 2024-06-29 ENCOUNTER — Ambulatory Visit: Admitting: Family Medicine

## 2024-06-29 ENCOUNTER — Encounter: Payer: Self-pay | Admitting: Family Medicine

## 2024-06-29 ENCOUNTER — Other Ambulatory Visit: Payer: Self-pay | Admitting: Family Medicine

## 2024-06-29 VITALS — BP 119/85 | HR 82 | Temp 98.6°F | Resp 18 | Ht 67.0 in | Wt 396.4 lb

## 2024-06-29 DIAGNOSIS — Z713 Dietary counseling and surveillance: Secondary | ICD-10-CM

## 2024-06-29 DIAGNOSIS — E66813 Obesity, class 3: Secondary | ICD-10-CM | POA: Diagnosis not present

## 2024-06-29 DIAGNOSIS — Z6841 Body Mass Index (BMI) 40.0 and over, adult: Secondary | ICD-10-CM | POA: Diagnosis not present

## 2024-06-29 DIAGNOSIS — Z7689 Persons encountering health services in other specified circumstances: Secondary | ICD-10-CM

## 2024-06-29 NOTE — Progress Notes (Signed)
 "  Established Patient Office Visit  Subjective    Patient ID: Danielle [name], female    DOB: July 07, 1985  Age: 38 y.o. MRN: [id number]  CC:  Chief Complaint  Patient presents with   Weight Check    HPI Danielle [name] presents for routine annual exam. Patient reports that she has had difficulty during the holidays. Patient denies acute complaints.  Outpatient Encounter Medications as of 06/29/2024  Medication Sig   albuterol  (PROAIR  HFA) 108 (90 Base) MCG/ACT inhaler Inhale 2 puffs into the lungs every 6 (six) hours as needed for wheezing or shortness of breath.   cetirizine (ZYRTEC) 10 MG tablet    fluticasone (FLONASE) 50 MCG/ACT nasal spray 2 sprays into each nostril daily.   nitrofurantoin , macrocrystal-monohydrate, (MACROBID ) 100 MG capsule Take 1 capsule (100 mg total) by mouth 2 (two) times daily.   semaglutide -weight management (WEGOVY ) 1 MG/0.5ML SOAJ SQ injection Inject 1 mg into the skin once a week.   tirzepatide  7.5 MG/0.5ML injection vial Inject 7.5 mg into the skin once a week.   traZODone (DESYREL) 50 MG tablet    [DISCONTINUED] famotidine (PEPCID) 10 MG tablet Take 10 mg by mouth 2 (two) times daily.   No facility-administered encounter medications on file as of 06/29/2024.    Past Medical History:  Diagnosis Date   Allergy    Asthma     History reviewed. No pertinent surgical history.  Family History  Problem Relation Age of Onset   Hypertension Mother    Healthy Father     Social History   Socioeconomic History   Marital status: Single    Spouse name: Not on file   Number of children: Not on file   Years of education: Not on file   Highest education level: Master's degree (e.g., MA, MS, MEng, MEd, MSW, MBA)  Occupational History   Not on file  Tobacco Use   Smoking status: Former    Current packs/day: 0.00    Average packs/day: 0.1 packs/day for 5.0 years (0.5 ttl pk-yrs)    Types: Cigarettes    Start date: 07/05/2002    Quit date: 07/06/2007     Years since quitting: 16.9   Smokeless tobacco: Never  Vaping Use   Vaping status: Some Days   Start date: 07/05/2021  Substance and Sexual Activity   Alcohol use: Yes    Comment: Occasionally   Drug use: No   Sexual activity: Yes    Birth control/protection: I.U.D.  Other Topics Concern   Not on file  Social History Narrative   Not on file   Social Drivers of Health   Tobacco Use: Medium Risk (06/29/2024)   Patient History    Smoking Tobacco Use: Former    Smokeless Tobacco Use: Never    Passive Exposure: Not on file  Financial Resource Strain: Low Risk (05/07/2024)   Overall Financial Resource Strain (CARDIA)    Difficulty of Paying Living Expenses: Not hard at all  Food Insecurity: No Food Insecurity (05/07/2024)   Epic    Worried About Programme Researcher, Broadcasting/film/video in the Last Year: Never true    Ran Out of Food in the Last Year: Never true  Transportation Needs: No Transportation Needs (05/07/2024)   Epic    Lack of Transportation (Medical): No    Lack of Transportation (Non-Medical): No  Physical Activity: Insufficiently Active (05/07/2024)   Exercise Vital Sign    Days of Exercise per Week: 2 days    Minutes of Exercise per  Session: 10 min  Stress: No Stress Concern Present (05/07/2024)   [employer] of Occupational Health - Occupational Stress Questionnaire    Feeling of Stress: Only a little  Social Connections: Moderately Isolated (05/07/2024)   Social Connection and Isolation Panel    Frequency of Communication with Friends and Family: More than three times a week    Frequency of Social Gatherings with Friends and Family: Once a week    Attends Religious Services: More than 4 times per year    Active Member of [employer] or Organizations: No    Attends Engineer, Structural: Not on file    Marital Status: Never married  Intimate Partner Violence: Not At Risk (08/19/2023)   Received from [facility]   Epic    Within the last year, have you been afraid of  your partner or ex-partner?: No    Within the last year, have you been humiliated or emotionally abused in other ways by your partner or ex-partner?: No    Within the last year, have you been kicked, hit, slapped, or otherwise physically hurt by your partner or ex-partner?: No    Within the last year, have you been raped or forced to have any kind of sexual activity by your partner or ex-partner?: No  Depression (PHQ2-9): Low Risk (06/29/2024)   Depression (PHQ2-9)    PHQ-2 Score: 0  Alcohol Screen: Low Risk (05/07/2024)   Alcohol Screen    Last Alcohol Screening Score (AUDIT): 1  Housing: Unknown (05/07/2024)   Epic    Unable to Pay for Housing in the Last Year: No    Number of Times Moved in the Last Year: Not on file    Homeless in the Last Year: No  Utilities: Not At Risk (09/13/2022)   AHC Utilities    Threatened with loss of utilities: No  Health Literacy: Adequate Health Literacy (06/09/2023)   B1300 Health Literacy    Frequency of need for help with medical instructions: Never    Review of Systems  All other systems reviewed and are negative.       Objective    BP 119/85   Pulse 82   Temp 98.6 F (37 C) (Oral)   Resp 18   Ht 5' 7 (1.702 m)   Wt (!) 396 lb 6.4 oz (179.8 kg)   SpO2 99%   BMI 62.09 kg/m   Physical Exam Vitals and nursing note reviewed.  Constitutional:      General: She is not in acute distress.    Appearance: She is obese.  Cardiovascular:     Rate and Rhythm: Normal rate and regular rhythm.  Pulmonary:     Effort: Pulmonary effort is normal.     Breath sounds: Normal breath sounds.  Abdominal:     Palpations: Abdomen is soft.     Tenderness: There is no abdominal tenderness.  Neurological:     General: No focal deficit present.     Mental Status: She is alert and oriented to person, place, and time.         Assessment & Plan:   Encounter for weight management  Class 3 severe obesity due to excess calories with serious  comorbidity and body mass index (BMI) of 60.0 to 69.9 in adult [facility])     Return in about 4 weeks (around 07/27/2024) for follow up, weight management.   Tanda Raguel SQUIBB, MD  "

## 2024-06-29 NOTE — Telephone Encounter (Signed)
 Requested medication (s) are due for refill today: Yes  Requested medication (s) are on the active medication list: Yes  Last refill:  06/05/24  Future visit scheduled: Yes  Notes to clinic:  Unable to refill due to no refill protocol for this medication.      Requested Prescriptions  Pending Prescriptions Disp Refills   tirzepatide  7.5 MG/0.5ML injection vial 2 mL 0    Sig: Inject 7.5 mg into the skin once a week.     There is no refill protocol information for this order

## 2024-06-29 NOTE — Telephone Encounter (Signed)
 Copied from CRM #[id number]. Topic: Clinical - Medication Refill >> Jun 29, 2024  9:40 AM Alexandria E wrote: Medication: tirzepatide  7.5 MG/0.5ML injection vial  Has the patient contacted their pharmacy? No (Agent: If no, request that the patient contact the pharmacy for the refill. If patient does not wish to contact the pharmacy document the reason why and proceed with request.) (Agent: If yes, when and what did the pharmacy advise?)  This is the patient's preferred pharmacy:  [facility] [phone], [place] - [address] [place] [address] Phone: [phone] Fax: [phone]  Is this the correct pharmacy for this prescription? Yes If no, delete pharmacy and type the correct one.   Has the prescription been filled recently? Yes  Is the patient out of the medication? Yes  Has the patient been seen for an appointment in the last year OR does the patient have an upcoming appointment? Yes  Can we respond through MyChart? Yes  Agent: Please be advised that Rx refills may take up to 3 business days. We ask that you follow-up with your pharmacy.

## 2024-07-02 MED ORDER — TIRZEPATIDE-WEIGHT MANAGEMENT 7.5 MG/0.5ML ~~LOC~~ SOLN: 7.5000 mg | SUBCUTANEOUS | 0 refills | Status: AC

## 2024-07-02 NOTE — Telephone Encounter (Signed)
 Complete

## 2024-07-30 ENCOUNTER — Ambulatory Visit: Payer: Self-pay | Admitting: Family Medicine

## 2024-08-02 ENCOUNTER — Ambulatory Visit: Admitting: Family Medicine

## 2024-08-02 ENCOUNTER — Encounter: Payer: Self-pay | Admitting: Family Medicine

## 2024-08-02 VITALS — BP 130/85 | HR 80 | Ht 67.0 in | Wt 394.0 lb

## 2024-08-02 DIAGNOSIS — Z6841 Body Mass Index (BMI) 40.0 and over, adult: Secondary | ICD-10-CM | POA: Diagnosis not present

## 2024-08-02 DIAGNOSIS — E66813 Obesity, class 3: Secondary | ICD-10-CM

## 2024-08-02 DIAGNOSIS — Z7689 Persons encountering health services in other specified circumstances: Secondary | ICD-10-CM

## 2024-08-02 MED ORDER — ZEPBOUND 10 MG/0.5ML ~~LOC~~ SOAJ: 10.0000 mg | SUBCUTANEOUS | 0 refills | Status: AC

## 2024-08-02 NOTE — Progress Notes (Signed)
 "  Established Patient Office Visit  Subjective    Patient ID: Danielle [name], female    DOB: 11/08/1985  Age: 39 y.o. MRN: [id number]  CC:  Chief Complaint  Patient presents with   Medical Management of Chronic Issues    HPI Danielle [name] presents for routine weight management. Patient reports that she developed nodular acne on zepbound  as opposed to wegovy . Patient was seen by derm who placed her on spironolactone. Patient would like to increase the dose of the zepbound .   Outpatient Encounter Medications as of 08/02/2024  Medication Sig   albuterol  (PROAIR  HFA) 108 (90 Base) MCG/ACT inhaler Inhale 2 puffs into the lungs every 6 (six) hours as needed for wheezing or shortness of breath.   buPROPion (WELLBUTRIN XL) 300 MG 24 hr tablet Take 300 mg by mouth daily.   cetirizine (ZYRTEC) 10 MG tablet    fluticasone (FLONASE) 50 MCG/ACT nasal spray 2 sprays into each nostril daily.   spironolactone (ALDACTONE) 50 MG tablet Take 100 mg by mouth once.   tirzepatide  (ZEPBOUND ) 10 MG/0.5ML Pen Inject 10 mg into the skin once a week.   tirzepatide  7.5 MG/0.5ML injection vial Inject 7.5 mg into the skin once a week.   traZODone (DESYREL) 50 MG tablet    ZEPBOUND  7.5 MG/0.5ML Pen Inject 7.5 mg into the skin once a week.   nitrofurantoin , macrocrystal-monohydrate, (MACROBID ) 100 MG capsule Take 1 capsule (100 mg total) by mouth 2 (two) times daily.   semaglutide -weight management (WEGOVY ) 1 MG/0.5ML SOAJ SQ injection Inject 1 mg into the skin once a week.   [DISCONTINUED] famotidine (PEPCID) 10 MG tablet Take 10 mg by mouth 2 (two) times daily.   No facility-administered encounter medications on file as of 08/02/2024.    Past Medical History:  Diagnosis Date   Allergy    Asthma     History reviewed. No pertinent surgical history.  Family History  Problem Relation Age of Onset   Hypertension Mother    Healthy Father     Social History   Socioeconomic History   Marital status:  Single    Spouse name: Not on file   Number of children: Not on file   Years of education: Not on file   Highest education level: Master's degree (e.g., MA, MS, MEng, MEd, MSW, MBA)  Occupational History   Not on file  Tobacco Use   Smoking status: Former    Current packs/day: 0.00    Average packs/day: 0.1 packs/day for 5.0 years (0.5 ttl pk-yrs)    Types: Cigarettes    Start date: 07/05/2002    Quit date: 07/06/2007    Years since quitting: 17.0   Smokeless tobacco: Never  Vaping Use   Vaping status: Some Days   Start date: 07/05/2021  Substance and Sexual Activity   Alcohol use: Yes    Comment: Occasionally   Drug use: No   Sexual activity: Yes    Birth control/protection: I.U.D.  Other Topics Concern   Not on file  Social History Narrative   Not on file   Social Drivers of Health   Tobacco Use: Medium Risk (08/02/2024)   Patient History    Smoking Tobacco Use: Former    Smokeless Tobacco Use: Never    Passive Exposure: Not on file  Financial Resource Strain: Low Risk (05/07/2024)   Overall Financial Resource Strain (CARDIA)    Difficulty of Paying Living Expenses: Not hard at all  Food Insecurity: No Food Insecurity (05/07/2024)  Epic    Worried About Programme Researcher, Broadcasting/film/video in the Last Year: Never true    [employer] of Food in the Last Year: Never true  Transportation Needs: No Transportation Needs (05/07/2024)   Epic    Lack of Transportation (Medical): No    Lack of Transportation (Non-Medical): No  Physical Activity: Insufficiently Active (05/07/2024)   Exercise Vital Sign    Days of Exercise per Week: 2 days    Minutes of Exercise per Session: 10 min  Stress: No Stress Concern Present (05/07/2024)   [employer] of Occupational Health - Occupational Stress Questionnaire    Feeling of Stress: Only a little  Social Connections: Moderately Isolated (05/07/2024)   Social Connection and Isolation Panel    Frequency of Communication with Friends and Family: More than  three times a week    Frequency of Social Gatherings with Friends and Family: Once a week    Attends Religious Services: More than 4 times per year    Active Member of [employer] or Organizations: No    Attends Engineer, Structural: Not on file    Marital Status: Never married  Intimate Partner Violence: Not At Risk (08/19/2023)   Received from [facility]   Epic    Within the last year, have you been afraid of your partner or ex-partner?: No    Within the last year, have you been humiliated or emotionally abused in other ways by your partner or ex-partner?: No    Within the last year, have you been kicked, hit, slapped, or otherwise physically hurt by your partner or ex-partner?: No    Within the last year, have you been raped or forced to have any kind of sexual activity by your partner or ex-partner?: No  Depression (PHQ2-9): Low Risk (06/29/2024)   Depression (PHQ2-9)    PHQ-2 Score: 0  Alcohol Screen: Low Risk (05/07/2024)   Alcohol Screen    Last Alcohol Screening Score (AUDIT): 1  Housing: Unknown (05/07/2024)   Epic    Unable to Pay for Housing in the Last Year: No    Number of Times Moved in the Last Year: Not on file    Homeless in the Last Year: No  Utilities: Not At Risk (09/13/2022)   AHC Utilities    Threatened with loss of utilities: No  Health Literacy: Adequate Health Literacy (06/09/2023)   B1300 Health Literacy    Frequency of need for help with medical instructions: Never    Review of Systems  All other systems reviewed and are negative.       Objective    BP 130/85   Pulse 80   Ht 5' 7 (1.702 m)   Wt (!) 394 lb (178.7 kg)   SpO2 98%   BMI 61.71 kg/m   Physical Exam Vitals and nursing note reviewed.  Constitutional:      General: She is not in acute distress.    Appearance: She is obese.  Cardiovascular:     Rate and Rhythm: Normal rate and regular rhythm.  Pulmonary:     Effort: Pulmonary effort is normal.     Breath sounds: Normal  breath sounds.  Abdominal:     Palpations: Abdomen is soft.     Tenderness: There is no abdominal tenderness.  Neurological:     General: No focal deficit present.     Mental Status: She is alert and oriented to person, place, and time.         Assessment &  Plan:   Encounter for weight management  Class 3 severe obesity due to excess calories with serious comorbidity and body mass index (BMI) of 60.0 to 69.9 in adult [facility])  Other orders -     Zepbound ; Inject 10 mg into the skin once a week.  Dispense: 2 mL; Refill: 0     Return in about 4 weeks (around 08/30/2024) for follow up.   Tanda Raguel SQUIBB, MD  "

## 2024-08-30 ENCOUNTER — Ambulatory Visit: Payer: Self-pay | Admitting: Family Medicine
